# Patient Record
Sex: Female | Born: 1960 | Race: White | Hispanic: No | Smoking: Former smoker
Health system: Southern US, Community
[De-identification: ages and names within clinical notes are randomized; demographics above are authoritative.]

## PROBLEM LIST (undated history)

## (undated) DIAGNOSIS — A630 Anogenital (venereal) warts: Secondary | ICD-10-CM

## (undated) DIAGNOSIS — E7800 Pure hypercholesterolemia, unspecified: Secondary | ICD-10-CM

## (undated) DIAGNOSIS — K219 Gastro-esophageal reflux disease without esophagitis: Secondary | ICD-10-CM

## (undated) DIAGNOSIS — E559 Vitamin D deficiency, unspecified: Secondary | ICD-10-CM

## (undated) HISTORY — DX: Vitamin D deficiency, unspecified: E55.9

## (undated) HISTORY — DX: Gastro-esophageal reflux disease without esophagitis: K21.9

## (undated) HISTORY — DX: Anogenital (venereal) warts: A63.0

## (undated) HISTORY — DX: Pure hypercholesterolemia, unspecified: E78.00

---

## 1996-09-15 HISTORY — PX: BREAST EXCISIONAL BIOPSY: SUR124

## 2005-08-14 ENCOUNTER — Ambulatory Visit: Payer: Self-pay

## 2008-03-07 ENCOUNTER — Ambulatory Visit: Payer: Self-pay

## 2009-05-10 ENCOUNTER — Ambulatory Visit: Payer: Self-pay

## 2010-05-14 ENCOUNTER — Ambulatory Visit: Payer: Self-pay

## 2010-09-15 LAB — HM PAP SMEAR

## 2010-11-25 ENCOUNTER — Ambulatory Visit: Payer: Self-pay | Admitting: Gastroenterology

## 2011-06-18 ENCOUNTER — Ambulatory Visit: Payer: Self-pay | Admitting: Family Medicine

## 2012-10-20 ENCOUNTER — Encounter: Payer: Self-pay | Admitting: Internal Medicine

## 2012-10-21 ENCOUNTER — Encounter: Payer: Self-pay | Admitting: Internal Medicine

## 2012-10-21 ENCOUNTER — Ambulatory Visit (INDEPENDENT_AMBULATORY_CARE_PROVIDER_SITE_OTHER): Payer: 59 | Admitting: Internal Medicine

## 2012-10-21 VITALS — BP 108/80 | HR 72 | Temp 98.8°F | Ht 66.5 in | Wt 163.8 lb

## 2012-10-21 DIAGNOSIS — B001 Herpesviral vesicular dermatitis: Secondary | ICD-10-CM

## 2012-10-21 DIAGNOSIS — L989 Disorder of the skin and subcutaneous tissue, unspecified: Secondary | ICD-10-CM

## 2012-10-21 DIAGNOSIS — B009 Herpesviral infection, unspecified: Secondary | ICD-10-CM

## 2012-10-21 DIAGNOSIS — F17200 Nicotine dependence, unspecified, uncomplicated: Secondary | ICD-10-CM

## 2012-10-21 DIAGNOSIS — R5381 Other malaise: Secondary | ICD-10-CM

## 2012-10-21 DIAGNOSIS — E7800 Pure hypercholesterolemia, unspecified: Secondary | ICD-10-CM

## 2012-10-21 DIAGNOSIS — R5383 Other fatigue: Secondary | ICD-10-CM

## 2012-10-21 DIAGNOSIS — Z139 Encounter for screening, unspecified: Secondary | ICD-10-CM

## 2012-10-21 DIAGNOSIS — Z720 Tobacco use: Secondary | ICD-10-CM

## 2012-10-21 DIAGNOSIS — E559 Vitamin D deficiency, unspecified: Secondary | ICD-10-CM

## 2012-10-21 DIAGNOSIS — K219 Gastro-esophageal reflux disease without esophagitis: Secondary | ICD-10-CM

## 2012-10-21 MED ORDER — PANTOPRAZOLE SODIUM 20 MG PO TBEC: 20.0000 mg | DELAYED_RELEASE_TABLET | Freq: Every day | ORAL | Status: DC

## 2012-10-22 ENCOUNTER — Telehealth: Payer: Self-pay | Admitting: *Deleted

## 2012-10-22 NOTE — Telephone Encounter (Signed)
Pt is coming in for labs on Monday 02.10.2014 what labs and dx would you like? Thank you  

## 2012-10-23 ENCOUNTER — Encounter: Payer: Self-pay | Admitting: Internal Medicine

## 2012-10-23 DIAGNOSIS — Z720 Tobacco use: Secondary | ICD-10-CM | POA: Insufficient documentation

## 2012-10-23 DIAGNOSIS — E559 Vitamin D deficiency, unspecified: Secondary | ICD-10-CM | POA: Insufficient documentation

## 2012-10-23 DIAGNOSIS — E7800 Pure hypercholesterolemia, unspecified: Secondary | ICD-10-CM | POA: Insufficient documentation

## 2012-10-23 DIAGNOSIS — K219 Gastro-esophageal reflux disease without esophagitis: Secondary | ICD-10-CM | POA: Insufficient documentation

## 2012-10-23 DIAGNOSIS — B001 Herpesviral vesicular dermatitis: Secondary | ICD-10-CM | POA: Insufficient documentation

## 2012-10-23 MED ORDER — FAMCICLOVIR 500 MG PO TABS: 500.0000 mg | ORAL_TABLET | Freq: Two times a day (BID) | ORAL | Status: DC | PRN

## 2012-10-23 NOTE — Assessment & Plan Note (Signed)
Takes protonix prn.  Follow.  Avoid foods that aggravate.

## 2012-10-23 NOTE — Assessment & Plan Note (Signed)
Discussed need for smoking cessation.  She is not ready to quit.  Follow.

## 2012-10-23 NOTE — Assessment & Plan Note (Signed)
Check vitamin D level 

## 2012-10-23 NOTE — Assessment & Plan Note (Signed)
Takes famvir intermittently.  Follow.

## 2012-10-23 NOTE — Assessment & Plan Note (Signed)
Low cholesterol diet and exercise.  Check lipid panel.   

## 2012-10-23 NOTE — Progress Notes (Signed)
Subjective:    Patient ID: Peggy Ward, female    DOB: Apr 13, 1961, 52 y.o.   MRN: [id number]  HPI 52 year old female with past history of hypercholesterolemia and GERD who comes in today to follow up on these issues as well as to establish care.  She states she is doing well.  Was previously followed at [facility] by Dr Luella Cook and has seen Dr Burnett Sheng.  Most recently has seen Dr Andrey Spearman.  Last physical 2012.  She has noticed some intermittent heaviness in her legs.  Occurs randomly.  No leg cramping.  Does have issues with spider veins.  She also has some issues with acid reflux.  Takes protonix prn.  May average once every two weeks - taking.  No significant sinus pressure.  She does intermittently notice some left earache.  May be related to weather change.  No change in hearing.  No earache now.  Some issues with fever blisters.  She also reports a persistent left upper arm skin lesion and a nasal lesion.  No nausea or vomiting.     Past Medical History  Diagnosis Date  . Hypercholesterolemia   . GERD (gastroesophageal reflux disease)   . Vitamin D deficiency   . Genital warts    Outpatient Encounter Prescriptions as of 10/21/2012  Medication Sig Dispense Refill  . famciclovir (FAMVIR) 500 MG tablet Take 500 mg by mouth 3 (three) times daily.      . fish oil-omega-3 fatty acids 1000 MG capsule Take 2 g by mouth daily.      . Flaxseed, Linseed, (FLAX SEED OIL) 1000 MG CAPS Take by mouth daily.      . pantoprazole (PROTONIX) 20 MG tablet Take 1 tablet (20 mg total) by mouth daily.  30 tablet  2  . [DISCONTINUED] pantoprazole (PROTONIX) 20 MG tablet Take 20 mg by mouth daily.       No facility-administered encounter medications on file as of 10/21/2012.    Review of Systems Patient denies any headache, lightheadedness or dizziness.  No significant sinus or allergy symptoms.  No earache now.  No chest pain, tightness or palpitations.  No increased shortness of breath, cough or  congestion.  No nausea or vomiting.  Intermittent acid reflux.  No abdominal pain or cramping.  No bowel change, such as diarrhea, constipation, BRBPR or melana.  No urine change.  Some left ankle discomfort at times.  Not a significant issue for her now.        Objective:   Physical Exam Filed Vitals:   10/21/12 1041  BP: 108/80  Pulse: 72  Temp: 98.8 F (63.76 C)   52 year old female in no acute distress.   HEENT:  Nares- clear.  Oropharynx - without lesions.  EAR:  Tympanic membrane without erythema.  NECK:  Supple.  Nontender.  No audible bruit.  HEART:  Appears to be regular. LUNGS:  No crackles or wheezing audible.  Respirations even and unlabored.  RADIAL PULSE:  Equal bilaterally.  ABDOMEN:  Soft, nontender.  Bowel sounds present and normal.  No audible abdominal bruit.   EXTREMITIES:  No increased edema present.  DP pulses palpable and equal bilaterally.   CIRCULATION:  DP pulses palpable and equal bilaterally.  Spider veins present.          Assessment & Plan:  LEG HEAVINESS.  Unclear as to the exact etiology.  Does have some venous insufficiency.  Support hose.  Follow.    EARACHE.  Not an issue  for her now.  No erythema on exam.  Follow.   DERMATOLOGY.  Persistent left upper arm lesion and nasal lesion.  Refer to dermatology for evaluation.    ALCOHOL USE.  Drinks 3-5 glasses of wine per day.  Discussed the need to cut back.  Follow.   FATIGUE.  Check cbc, met c and tsh.   HEALTH MAINTENANCE.  Schedule a physical next visit.  Schedule mammogram.  Obtain outside records.  Colonoscopy 2013 - Dr Bluford Kaufmann.

## 2012-10-24 NOTE — Telephone Encounter (Signed)
Labs are ordered and in system.  Thanks.

## 2012-10-25 ENCOUNTER — Other Ambulatory Visit: Payer: 59

## 2012-12-07 ENCOUNTER — Ambulatory Visit: Payer: Self-pay | Admitting: Internal Medicine

## 2012-12-08 ENCOUNTER — Other Ambulatory Visit (INDEPENDENT_AMBULATORY_CARE_PROVIDER_SITE_OTHER): Payer: 59

## 2012-12-08 DIAGNOSIS — E559 Vitamin D deficiency, unspecified: Secondary | ICD-10-CM

## 2012-12-08 DIAGNOSIS — R5383 Other fatigue: Secondary | ICD-10-CM

## 2012-12-08 DIAGNOSIS — E7800 Pure hypercholesterolemia, unspecified: Secondary | ICD-10-CM

## 2012-12-08 LAB — CBC WITH DIFFERENTIAL/PLATELET
Basophils Relative: 0.7 % (ref 0.0–3.0)
Eosinophils Absolute: 0.1 10*3/uL (ref 0.0–0.7)
Eosinophils Relative: 1.8 % (ref 0.0–5.0)
HCT: 37 % (ref 36.0–46.0)
Hemoglobin: 12.6 g/dL (ref 12.0–15.0)
MCHC: 34.1 g/dL (ref 30.0–36.0)
MCV: 97.1 fl (ref 78.0–100.0)
Monocytes Absolute: 0.4 10*3/uL (ref 0.1–1.0)
Neutro Abs: 3.2 10*3/uL (ref 1.4–7.7)
Neutrophils Relative %: 53.3 % (ref 43.0–77.0)
RBC: 3.81 Mil/uL — ABNORMAL LOW (ref 3.87–5.11)
WBC: 5.9 10*3/uL (ref 4.5–10.5)

## 2012-12-08 LAB — COMPREHENSIVE METABOLIC PANEL
AST: 25 U/L (ref 0–37)
Alkaline Phosphatase: 74 U/L (ref 39–117)
BUN: 12 mg/dL (ref 6–23)
Creatinine, Ser: 0.5 mg/dL (ref 0.4–1.2)
Glucose, Bld: 90 mg/dL (ref 70–99)
Potassium: 3.8 mEq/L (ref 3.5–5.1)
Total Bilirubin: 0.5 mg/dL (ref 0.3–1.2)

## 2012-12-08 LAB — LIPID PANEL
Cholesterol: 184 mg/dL (ref 0–200)
LDL Cholesterol: 101 mg/dL — ABNORMAL HIGH (ref 0–99)
Total CHOL/HDL Ratio: 4
VLDL: 37.4 mg/dL (ref 0.0–40.0)

## 2012-12-09 ENCOUNTER — Telehealth: Payer: Self-pay | Admitting: Internal Medicine

## 2012-12-09 DIAGNOSIS — E559 Vitamin D deficiency, unspecified: Secondary | ICD-10-CM

## 2012-12-09 MED ORDER — ERGOCALCIFEROL 1.25 MG (50000 UT) PO CAPS: 50000.0000 [IU] | ORAL_CAPSULE | ORAL | Status: DC

## 2012-12-09 NOTE — Telephone Encounter (Signed)
Notify pt that her triglycerides are just slightly elevated above goal of 150, but overall look good.  Her cholesterol looks good.  Her other labs are ok except for her vitamin D level is low.  I want her to start a prescription vitamin D supplement (ergocalciferol) that she will take once a week.  I will need to recheck a vitamin D level on her in 7 weeks.  I will send the prescription to her pharmacy.  Please schedule her for a non fasting lab appt in 7 weeks.

## 2012-12-09 NOTE — Telephone Encounter (Signed)
Patient notified and non fasting lab appointment sheduled.

## 2012-12-14 ENCOUNTER — Telehealth: Payer: Self-pay | Admitting: Internal Medicine

## 2012-12-14 NOTE — Telephone Encounter (Signed)
Pt was per scribed Vitamin D and was told to come back in 7 weeks for another recheck but was only per scribed 4 pills. She was wondering if she should come back in a month to have that recheck or if the rx was written wrong.

## 2012-12-14 NOTE — Telephone Encounter (Signed)
Confirm with pt that she is taking the vitamin D once a week.  If so, she is to continue once a week and then she has one refill.

## 2012-12-14 NOTE — Telephone Encounter (Signed)
Pt was notified and advised to contact office before she took her last pill.

## 2012-12-20 ENCOUNTER — Encounter: Payer: Self-pay | Admitting: Internal Medicine

## 2012-12-20 ENCOUNTER — Ambulatory Visit (INDEPENDENT_AMBULATORY_CARE_PROVIDER_SITE_OTHER): Payer: 59 | Admitting: Internal Medicine

## 2012-12-20 VITALS — BP 118/60 | HR 78 | Temp 98.4°F | Resp 18 | Wt 159.5 lb

## 2012-12-20 DIAGNOSIS — Z124 Encounter for screening for malignant neoplasm of cervix: Secondary | ICD-10-CM

## 2012-12-20 DIAGNOSIS — E7800 Pure hypercholesterolemia, unspecified: Secondary | ICD-10-CM

## 2012-12-20 DIAGNOSIS — B001 Herpesviral vesicular dermatitis: Secondary | ICD-10-CM

## 2012-12-20 DIAGNOSIS — F17200 Nicotine dependence, unspecified, uncomplicated: Secondary | ICD-10-CM

## 2012-12-20 DIAGNOSIS — K219 Gastro-esophageal reflux disease without esophagitis: Secondary | ICD-10-CM

## 2012-12-20 DIAGNOSIS — B009 Herpesviral infection, unspecified: Secondary | ICD-10-CM

## 2012-12-20 DIAGNOSIS — Z720 Tobacco use: Secondary | ICD-10-CM

## 2012-12-20 DIAGNOSIS — E559 Vitamin D deficiency, unspecified: Secondary | ICD-10-CM

## 2012-12-21 ENCOUNTER — Other Ambulatory Visit (HOSPITAL_COMMUNITY)
Admission: RE | Admit: 2012-12-21 | Discharge: 2012-12-21 | Disposition: A | Discharge status: Home / Self Care | Payer: 59 | Source: Ambulatory Visit | Attending: Internal Medicine | Admitting: Internal Medicine

## 2012-12-21 DIAGNOSIS — Z1151 Encounter for screening for human papillomavirus (HPV): Secondary | ICD-10-CM | POA: Insufficient documentation

## 2012-12-21 DIAGNOSIS — Z01419 Encounter for gynecological examination (general) (routine) without abnormal findings: Secondary | ICD-10-CM | POA: Insufficient documentation

## 2012-12-23 ENCOUNTER — Encounter: Payer: Self-pay | Admitting: Internal Medicine

## 2012-12-27 ENCOUNTER — Encounter: Payer: Self-pay | Admitting: Internal Medicine

## 2012-12-27 NOTE — Progress Notes (Signed)
  Subjective:    Patient ID: Peggy Ward, female    DOB: 1961-01-11, 52 y.o.   MRN: [id number]  Gynecologic Exam  52 year old female with past history of hypercholesterolemia and GERD who comes in today to follow up on these issues as well as for a complete physical exam.  She states she is doing well.  Denies any cardiac symptoms with increased activity or exertion.  No acid reflux.  No nausea or vomiting.  No bowel change.       Past Medical History  Diagnosis Date  . Hypercholesterolemia   . GERD (gastroesophageal reflux disease)   . Vitamin D deficiency   . Genital warts     Outpatient Encounter Prescriptions as of 12/20/2012  Medication Sig Dispense Refill  . ergocalciferol (VITAMIN D2) 50000 UNITS capsule Take 1 capsule (50,000 Units total) by mouth once a week.  4 capsule  1  . famciclovir (FAMVIR) 500 MG tablet Take 1 tablet (500 mg total) by mouth 2 (two) times daily as needed.  30 tablet  0  . fish oil-omega-3 fatty acids 1000 MG capsule Take 2 g by mouth daily.      . Flaxseed, Linseed, (FLAX SEED OIL) 1000 MG CAPS Take by mouth daily.      . pantoprazole (PROTONIX) 20 MG tablet Take 1 tablet (20 mg total) by mouth daily.  30 tablet  2   No facility-administered encounter medications on file as of 12/20/2012.    Review of Systems Patient denies any headache, lightheadedness or dizziness.  No significant sinus or allergy symptoms.  No chest pain, tightness or palpitations.  No increased shortness of breath, cough or congestion.  No nausea or vomiting.  No acid reflux reported.  On protonix.  No abdominal pain or cramping.  No bowel change, such as diarrhea, constipation, BRBPR or melana.  No urine change.  Discussed support hose last visit for leg aching.          Objective:   Physical Exam  Filed Vitals:   12/20/12 1545  BP: 118/60  Pulse: 78  Temp: 98.4 F (36.9 C)  Resp: 18   Pulse recheck:  36  51 year old female in no acute distress.   HEENT:  Nares- clear.   Oropharynx - without lesions. NECK:  Supple.  Nontender.  No audible bruit.  HEART:  Appears to be regular. LUNGS:  No crackles or wheezing audible.  Respirations even and unlabored.  RADIAL PULSE:  Equal bilaterally.    BREASTS:  No nipple discharge or nipple retraction present.  Could not appreciate any distinct nodules or axillary adenopathy.  ABDOMEN:  Soft, nontender.  Bowel sounds present and normal.  No audible abdominal bruit.  GU:  Normal external genitalia.  Vaginal vault without lesions.  Cervix identified.  Pap performed. Could not appreciate any adnexal masses or tenderness.   RECTAL:  Heme negative.   EXTREMITIES:  No increased edema present.  DP pulses palpable and equal bilaterally.          Assessment & Plan:  LEG HEAVINESS.  Does have some venous insufficiency.  Support hose.  Follow.    ALCOHOL USE.  Drinks 3-5 glasses of wine per day.  Have discussed the need to cut back.  Follow.   HEALTH MAINTENANCE.  Physical today.  Mammogram 12/07/12 - [facility] I.   Colonoscopy 11/25/10 (Dr Bluford Kaufmann) - sigmoid polyp (tubular adenomatous polyp) and diverticulosis.  Recommended repeat colonoscopy 2017.

## 2012-12-27 NOTE — Assessment & Plan Note (Signed)
Vitamin D level just checked - 14.  On ergocalciferol 50,000 units q week now.  Off the otc supolement.  Recheck vitamin D level in a couple of months.

## 2012-12-27 NOTE — Assessment & Plan Note (Signed)
Takes famvir intermittently.  Follow.

## 2012-12-27 NOTE — Assessment & Plan Note (Signed)
Low cholesterol diet and exercise.  Llipid panel just checked 12/08/12 revealed total cholesterol 184, triglycerides 187, HDL 45 and LDL 101.

## 2012-12-27 NOTE — Assessment & Plan Note (Signed)
Have discussed need for smoking cessation.  She is not ready to quit.  Follow.

## 2012-12-27 NOTE — Assessment & Plan Note (Signed)
Takes protonix.  Follow.  Avoid foods that aggravate.

## 2013-01-27 ENCOUNTER — Other Ambulatory Visit (INDEPENDENT_AMBULATORY_CARE_PROVIDER_SITE_OTHER): Payer: 59

## 2013-01-27 DIAGNOSIS — E559 Vitamin D deficiency, unspecified: Secondary | ICD-10-CM

## 2013-01-28 LAB — VITAMIN D 25 HYDROXY (VIT D DEFICIENCY, FRACTURES): Vit D, 25-Hydroxy: 45 ng/mL (ref 30–89)

## 2013-06-20 ENCOUNTER — Ambulatory Visit: Payer: 59 | Admitting: Internal Medicine

## 2013-07-11 ENCOUNTER — Encounter: Payer: Self-pay | Admitting: Internal Medicine

## 2013-07-11 ENCOUNTER — Ambulatory Visit (INDEPENDENT_AMBULATORY_CARE_PROVIDER_SITE_OTHER): Payer: 59 | Admitting: Internal Medicine

## 2013-07-11 ENCOUNTER — Encounter (INDEPENDENT_AMBULATORY_CARE_PROVIDER_SITE_OTHER): Payer: Self-pay

## 2013-07-11 VITALS — BP 120/70 | HR 74 | Temp 97.7°F | Ht 66.5 in | Wt 162.0 lb

## 2013-07-11 DIAGNOSIS — B001 Herpesviral vesicular dermatitis: Secondary | ICD-10-CM

## 2013-07-11 DIAGNOSIS — E559 Vitamin D deficiency, unspecified: Secondary | ICD-10-CM

## 2013-07-11 DIAGNOSIS — B009 Herpesviral infection, unspecified: Secondary | ICD-10-CM

## 2013-07-11 DIAGNOSIS — Z23 Encounter for immunization: Secondary | ICD-10-CM

## 2013-07-11 DIAGNOSIS — K219 Gastro-esophageal reflux disease without esophagitis: Secondary | ICD-10-CM

## 2013-07-11 DIAGNOSIS — F17200 Nicotine dependence, unspecified, uncomplicated: Secondary | ICD-10-CM

## 2013-07-11 DIAGNOSIS — E7800 Pure hypercholesterolemia, unspecified: Secondary | ICD-10-CM

## 2013-07-11 DIAGNOSIS — Z720 Tobacco use: Secondary | ICD-10-CM

## 2013-07-12 ENCOUNTER — Encounter: Payer: Self-pay | Admitting: Internal Medicine

## 2013-07-12 NOTE — Assessment & Plan Note (Signed)
Last vitamin D level wnl.  Off rx vitamin D now.  Taking vitamin D3 1000 q day.  Follow.

## 2013-07-12 NOTE — Assessment & Plan Note (Signed)
Low cholesterol diet and exercise.  Llipid panel last checked 12/08/12 revealed total cholesterol 184, triglycerides 187, HDL 45 and LDL 101.  Will follow.

## 2013-07-12 NOTE — Assessment & Plan Note (Signed)
Discussed need for smoking cessation.  She is not ready to quit.  Has cut down.  Follow.   

## 2013-07-12 NOTE — Assessment & Plan Note (Signed)
Takes famvir intermittently.  Follow.

## 2013-07-12 NOTE — Progress Notes (Signed)
  Subjective:    Patient ID: Peggy Ward, female    DOB: 1961-04-30, 52 y.o.   MRN: [id number]  HPI 52 year old female with past history of hypercholesterolemia and GERD who comes in today for a scheduled follow up.  She states she is doing well.   On protonix prn.  States if she watches what she eats, she does not have a significant issue with acid reflux.   No significant sinus pressure.  Breathing stable.  Eating and drinking well.   No nausea or vomiting.  Bowels stable.  Recent vitamin D level wnl.  Off rx vit D and is now taking vit D3 1000 q day.  States misses some days.      Past Medical History  Diagnosis Date  . Hypercholesterolemia   . GERD (gastroesophageal reflux disease)   . Vitamin D deficiency   . Genital warts     Outpatient Encounter Prescriptions as of 07/11/2013  Medication Sig Dispense Refill  . Cholecalciferol (VITAMIN D3) 1000 UNITS CAPS Take 1 capsule by mouth daily.      . famciclovir (FAMVIR) 500 MG tablet Take 1 tablet (500 mg total) by mouth 2 (two) times daily as needed.  30 tablet  0  . pantoprazole (PROTONIX) 20 MG tablet Take 20 mg by mouth daily as needed.      . [DISCONTINUED] pantoprazole (PROTONIX) 20 MG tablet Take 1 tablet (20 mg total) by mouth daily.  30 tablet  2  . fish oil-omega-3 fatty acids 1000 MG capsule Take 2 g by mouth daily.      . Flaxseed, Linseed, (FLAX SEED OIL) 1000 MG CAPS Take by mouth daily.      . [DISCONTINUED] ergocalciferol (VITAMIN D2) 50000 UNITS capsule Take 1 capsule (50,000 Units total) by mouth once a week.  4 capsule  1   No facility-administered encounter medications on file as of 07/11/2013.    Review of Systems Patient denies any headache, lightheadedness or dizziness.  No significant sinus or allergy symptoms.  No chest pain, tightness or palpitations.  No increased shortness of breath, cough or congestion.  No nausea or vomiting.  Intermittent acid reflux.  States if she watches what she eats - no problems.  No  abdominal pain or cramping.  No bowel change, such as diarrhea, constipation, BRBPR or melana.  No urine change.        Objective:   Physical Exam  Filed Vitals:   07/11/13 0946  BP: 120/70  Pulse: 74  Temp: 97.7 F (3.81 C)   52 year old female in no acute distress.   HEENT:  Nares- clear.  Oropharynx - without lesions.  NECK:  Supple.  Nontender.  No audible bruit.  HEART:  Appears to be regular. LUNGS:  No crackles or wheezing audible.  Respirations even and unlabored.  RADIAL PULSE:  Equal bilaterally.  ABDOMEN:  Soft, nontender.  Bowel sounds present and normal.  No audible abdominal bruit.   EXTREMITIES:  No increased edema present.  DP pulses palpable and equal bilaterally.          Assessment & Plan:  ALCOHOL USE.  Discussed again with her today.  She has cut back on her alcohol intake.  Does not drink daily now.  Follow.     HEALTH MAINTENANCE.  Physical 12/20/12.  Pap negative.   Colonoscopy 11/25/10.  Recommended f/u colonoscopy 2017.  Mammogram 12/07/12 - Birads I.

## 2013-07-12 NOTE — Assessment & Plan Note (Signed)
Takes protonix prn.  Follow.  Avoid foods that aggravate.  Controlled if she watches her diet.

## 2013-12-21 ENCOUNTER — Other Ambulatory Visit: Payer: 59

## 2013-12-23 ENCOUNTER — Encounter: Payer: 59 | Admitting: Internal Medicine

## 2014-02-14 ENCOUNTER — Ambulatory Visit: Payer: Self-pay | Admitting: Internal Medicine

## 2014-02-14 LAB — HM MAMMOGRAPHY: HM Mammogram: NEGATIVE

## 2014-02-15 ENCOUNTER — Encounter: Payer: Self-pay | Admitting: Internal Medicine

## 2014-02-21 ENCOUNTER — Other Ambulatory Visit: Payer: 59

## 2014-02-24 ENCOUNTER — Encounter: Payer: 59 | Admitting: Internal Medicine

## 2014-02-28 ENCOUNTER — Other Ambulatory Visit (INDEPENDENT_AMBULATORY_CARE_PROVIDER_SITE_OTHER): Payer: 59

## 2014-02-28 DIAGNOSIS — E7800 Pure hypercholesterolemia, unspecified: Secondary | ICD-10-CM

## 2014-02-28 DIAGNOSIS — E559 Vitamin D deficiency, unspecified: Secondary | ICD-10-CM

## 2014-02-28 DIAGNOSIS — K219 Gastro-esophageal reflux disease without esophagitis: Secondary | ICD-10-CM

## 2014-02-28 LAB — COMPREHENSIVE METABOLIC PANEL
ALBUMIN: 4 g/dL (ref 3.5–5.2)
ALT: 19 U/L (ref 0–35)
AST: 23 U/L (ref 0–37)
Alkaline Phosphatase: 67 U/L (ref 39–117)
BUN: 12 mg/dL (ref 6–23)
CALCIUM: 9 mg/dL (ref 8.4–10.5)
CHLORIDE: 105 meq/L (ref 96–112)
CO2: 26 meq/L (ref 19–32)
Creatinine, Ser: 0.5 mg/dL (ref 0.4–1.2)
GFR: 127.8 mL/min (ref >60.00)
GLUCOSE: 86 mg/dL (ref 70–99)
POTASSIUM: 4 meq/L (ref 3.5–5.1)
Sodium: 138 mEq/L (ref 135–145)
Total Bilirubin: 0.1 mg/dL — ABNORMAL LOW (ref 0.2–1.2)
Total Protein: 7.1 g/dL (ref 6.0–8.3)

## 2014-02-28 LAB — CBC WITH DIFFERENTIAL/PLATELET
BASOS ABS: 0.1 10*3/uL (ref 0.0–0.1)
BASOS PCT: 0.9 % (ref 0.0–3.0)
EOS ABS: 0.1 10*3/uL (ref 0.0–0.7)
Eosinophils Relative: 2.2 % (ref 0.0–5.0)
HCT: 38.6 % (ref 36.0–46.0)
Hemoglobin: 13.1 g/dL (ref 12.0–15.0)
Lymphocytes Relative: 35.1 % (ref 12.0–46.0)
Lymphs Abs: 2.3 10*3/uL (ref 0.7–4.0)
MCHC: 33.8 g/dL (ref 30.0–36.0)
MCV: 97.6 fl (ref 78.0–100.0)
Monocytes Absolute: 0.5 10*3/uL (ref 0.1–1.0)
Monocytes Relative: 7.6 % (ref 3.0–12.0)
NEUTROS PCT: 54.2 % (ref 43.0–77.0)
Neutro Abs: 3.5 10*3/uL (ref 1.4–7.7)
Platelets: 240 10*3/uL (ref 150.0–400.0)
RBC: 3.95 Mil/uL (ref 3.87–5.11)
RDW: 13 % (ref 11.5–15.5)
WBC: 6.5 10*3/uL (ref 4.0–10.5)

## 2014-02-28 LAB — LIPID PANEL
CHOLESTEROL: 220 mg/dL — AB (ref 0–200)
HDL: 48.2 mg/dL (ref >39.00)
LDL Cholesterol: 113 mg/dL — ABNORMAL HIGH (ref 0–99)
NonHDL: 171.8
TRIGLYCERIDES: 296 mg/dL — AB (ref 0.0–149.0)
Total CHOL/HDL Ratio: 5
VLDL: 59.2 mg/dL — ABNORMAL HIGH (ref 0.0–40.0)

## 2014-02-28 LAB — TSH: TSH: 1.38 u[IU]/mL (ref 0.35–4.50)

## 2014-03-01 LAB — VITAMIN D 25 HYDROXY (VIT D DEFICIENCY, FRACTURES): VIT D 25 HYDROXY: 34 ng/mL (ref 30–89)

## 2014-03-07 ENCOUNTER — Other Ambulatory Visit: Payer: Self-pay | Admitting: Internal Medicine

## 2014-03-07 ENCOUNTER — Encounter: Payer: Self-pay | Admitting: Internal Medicine

## 2014-03-07 ENCOUNTER — Ambulatory Visit (INDEPENDENT_AMBULATORY_CARE_PROVIDER_SITE_OTHER): Payer: 59 | Admitting: Internal Medicine

## 2014-03-07 VITALS — BP 110/80 | HR 70 | Temp 98.3°F | Ht 67.0 in | Wt 163.5 lb

## 2014-03-07 DIAGNOSIS — E7800 Pure hypercholesterolemia, unspecified: Secondary | ICD-10-CM

## 2014-03-07 DIAGNOSIS — E559 Vitamin D deficiency, unspecified: Secondary | ICD-10-CM

## 2014-03-07 DIAGNOSIS — F17200 Nicotine dependence, unspecified, uncomplicated: Secondary | ICD-10-CM

## 2014-03-07 DIAGNOSIS — Z720 Tobacco use: Secondary | ICD-10-CM

## 2014-03-07 DIAGNOSIS — K219 Gastro-esophageal reflux disease without esophagitis: Secondary | ICD-10-CM

## 2014-03-07 NOTE — Progress Notes (Signed)
Pre visit review using our clinic review tool, if applicable. No additional management support is needed unless otherwise documented below in the visit note. 

## 2014-03-12 ENCOUNTER — Encounter: Payer: Self-pay | Admitting: Internal Medicine

## 2014-03-12 NOTE — Assessment & Plan Note (Signed)
Low cholesterol diet and exercise.  Llipid panel just checked and revealed total cholesterol 220, triglycerides 296, HDL 48 and LDL 113.  Add fish oil.  Follow.

## 2014-03-12 NOTE — Progress Notes (Signed)
  Subjective:    Patient ID: Peggy Ward, female    DOB: 08/26/1961, 53 y.o.   MRN: [id number]  HPI 53 year old female with past history of hypercholesterolemia and GERD who comes in today to follow up on these issues as well as for a complete physical exam.   She states she is doing well.  Tries to stay active.  No cardiac symptoms with increased activity or exertion.  No acid reflux reported.  Has protonix.  Bowels stable.  Overall she feels she is doing relatively well.  Does report some bilateral hip discomfort.  Not constant.      Past Medical History  Diagnosis Date  . Hypercholesterolemia   . GERD (gastroesophageal reflux disease)   . Vitamin D deficiency   . Genital warts     Outpatient Encounter Prescriptions as of 03/07/2014  Medication Sig  . Cholecalciferol (VITAMIN D3) 1000 UNITS CAPS Take 1 capsule by mouth daily.  . famciclovir (FAMVIR) 500 MG tablet Take 1 tablet (500 mg total) by mouth 2 (two) times daily as needed.  . fish oil-omega-3 fatty acids 1000 MG capsule Take 2 g by mouth daily.  . Flaxseed, Linseed, (FLAX SEED OIL) 1000 MG CAPS Take by mouth daily.  . pantoprazole (PROTONIX) 20 MG tablet TAKE 1 TABLET (20 MG TOTAL) BY MOUTH DAILY.  . [DISCONTINUED] pantoprazole (PROTONIX) 20 MG tablet Take 20 mg by mouth daily as needed.    Review of Systems Patient denies any headache, lightheadedness or dizziness.  No significant sinus or allergy symptoms.   No chest pain, tightness or palpitations.  No increased shortness of breath, cough or congestion.  No nausea or vomiting.  Reported no increased acid reflux.  No abdominal pain or cramping.  No bowel change, such as diarrhea, constipation, BRBPR or melana.  No urine change.  Recent labs revealed increased triglycerides.        Objective:   Physical Exam  Filed Vitals:   03/07/14 1516  BP: 110/80  Pulse: 70  Temp: 98.3 F (4236.648 C)   53 year old female in no acute distress.   HEENT:  Nares- clear.  Oropharynx  - without lesions. NECK:  Supple.  Nontender.  No audible bruit.  HEART:  Appears to be regular. LUNGS:  No crackles or wheezing audible.  Respirations even and unlabored.  RADIAL PULSE:  Equal bilaterally.    BREASTS:  No nipple discharge or nipple retraction present.  Could not appreciate any distinct nodules or axillary adenopathy.  ABDOMEN:  Soft, nontender.  Bowel sounds present and normal.  No audible abdominal bruit.  GU:  Not performed.     EXTREMITIES:  No increased edema present.  DP pulses palpable and equal bilaterally.           Assessment & Plan:  HEALTH MAINTENANCE.  Physical today.  Mammogram 02/14/14 - Birads I.   Colonoscopy 2013 - Dr Bluford Kaufmannh.  PAP 12/22/12 negative with negative HPV.

## 2014-03-12 NOTE — Assessment & Plan Note (Signed)
Takes protonix.  Follow.  Avoid foods that aggravate.  Controlled if she watches her diet.

## 2014-03-12 NOTE — Assessment & Plan Note (Signed)
Discussed need for smoking cessation.  She is not ready to quit.  Has cut down.  Follow.

## 2014-03-12 NOTE — Assessment & Plan Note (Addendum)
Taking vitamin D3 1000 q day.  Follow.  Vitamin D level just checked and wnl.

## 2014-07-10 ENCOUNTER — Other Ambulatory Visit: Payer: 59

## 2014-07-19 ENCOUNTER — Other Ambulatory Visit: Payer: 59

## 2015-03-12 ENCOUNTER — Encounter: Payer: Self-pay | Admitting: Internal Medicine

## 2015-03-12 ENCOUNTER — Ambulatory Visit (INDEPENDENT_AMBULATORY_CARE_PROVIDER_SITE_OTHER): Payer: Commercial Managed Care - PPO | Admitting: Internal Medicine

## 2015-03-12 VITALS — BP 110/70 | HR 66 | Temp 98.5°F | Ht 66.5 in | Wt 163.5 lb

## 2015-03-12 DIAGNOSIS — Z1239 Encounter for other screening for malignant neoplasm of breast: Secondary | ICD-10-CM

## 2015-03-12 DIAGNOSIS — E7800 Pure hypercholesterolemia, unspecified: Secondary | ICD-10-CM

## 2015-03-12 DIAGNOSIS — K219 Gastro-esophageal reflux disease without esophagitis: Secondary | ICD-10-CM

## 2015-03-12 DIAGNOSIS — Z720 Tobacco use: Secondary | ICD-10-CM | POA: Diagnosis not present

## 2015-03-12 DIAGNOSIS — Z0000 Encounter for general adult medical examination without abnormal findings: Secondary | ICD-10-CM

## 2015-03-12 DIAGNOSIS — E559 Vitamin D deficiency, unspecified: Secondary | ICD-10-CM

## 2015-03-12 NOTE — Progress Notes (Signed)
Pre visit review using our clinic review tool, if applicable. No additional management support is needed unless otherwise documented below in the visit note. 

## 2015-03-12 NOTE — Progress Notes (Signed)
Patient ID: Peggy Ward, female   DOB: [birth year], 54 y.o.   MRN: [id number]   Subjective:    Patient ID: Peggy Ward, female    DOB: 10-30-1960, 54 y.o.   MRN: [id number]  HPI  Patient here to follow up on her current medical issues as well as for a complete physical exam.  Tries to stay active.  Stress is better.  New job.  Enjoys her job.  Tries to stay active.  No cardiac symptoms with increased activity or exertion.  Breathing stable.  Bowels stable.  Still smoking.  Has cut down.  Discussed the need to quit.  Discussed treatment options.     Past Medical History  Diagnosis Date  . Hypercholesterolemia   . GERD (gastroesophageal reflux disease)   . Vitamin D deficiency   . Genital warts     Current Outpatient Prescriptions on File Prior to Visit  Medication Sig Dispense Refill  . famciclovir (FAMVIR) 500 MG tablet Take 1 tablet (500 mg total) by mouth 2 (two) times daily as needed. 30 tablet 0  . pantoprazole (PROTONIX) 20 MG tablet TAKE 1 TABLET (20 MG TOTAL) BY MOUTH DAILY. (Patient taking differently: TAKE 1 TABLET (20 MG TOTAL) BY MOUTH PRN) 30 tablet 2  . Cholecalciferol (VITAMIN D3) 1000 UNITS CAPS Take 1 capsule by mouth daily.    . fish oil-omega-3 fatty acids 1000 MG capsule Take 2 g by mouth daily.    . Flaxseed, Linseed, (FLAX SEED OIL) 1000 MG CAPS Take by mouth daily.     No current facility-administered medications on file prior to visit.    Review of Systems  Constitutional: Negative for appetite change and unexpected weight change.  HENT: Negative for congestion and sinus pressure.   Eyes: Negative for pain and visual disturbance.  Respiratory: Negative for cough, chest tightness and shortness of breath.   Cardiovascular: Negative for chest pain, palpitations and leg swelling.  Gastrointestinal: Negative for nausea, vomiting, abdominal pain and diarrhea.  Genitourinary: Negative for dysuria and difficulty urinating.  Musculoskeletal: Negative for back  pain and joint swelling.  Skin: Negative for color change and rash.  Neurological: Negative for dizziness, light-headedness and headaches.  Hematological: Negative for adenopathy. Does not bruise/bleed easily.  Psychiatric/Behavioral: Negative for dysphoric mood and agitation.       Objective:    Physical Exam  Constitutional: She is oriented to person, place, and time. She appears well-developed and well-nourished.  HENT:  Nose: Nose normal.  Mouth/Throat: Oropharynx is clear and moist.  Eyes: Right eye exhibits no discharge. Left eye exhibits no discharge. No scleral icterus.  Neck: Neck supple. No thyromegaly present.  Cardiovascular: Normal rate and regular rhythm.   Pulmonary/Chest: Breath sounds normal. No accessory muscle usage. No tachypnea. No respiratory distress. She has no decreased breath sounds. She has no wheezes. She has no rhonchi. Right breast exhibits no inverted nipple, no mass, no nipple discharge and no tenderness (no axillary adenopathy). Left breast exhibits no inverted nipple, no mass, no nipple discharge and no tenderness (no axilarry adenopathy).  Abdominal: Soft. Bowel sounds are normal. There is no tenderness.  Musculoskeletal: She exhibits no edema or tenderness.  Lymphadenopathy:    She has no cervical adenopathy.  Neurological: She is alert and oriented to person, place, and time.  Skin: Skin is warm. No rash noted.  Psychiatric: She has a normal mood and affect. Her behavior is normal.    BP 110/70 mmHg  Pulse 66  Temp(Src) 98.5 F (36.9  C) (Oral)  Ht 5' 6.5" (1.689 m)  Wt 163 lb 8 oz (74.163 kg)  BMI 26.00 kg/m2  SpO2 99% Wt Readings from Last 3 Encounters:  03/12/15 163 lb 8 oz (74.163 kg)  03/07/14 163 lb 8 oz (74.163 kg)  07/11/13 162 lb (73.483 kg)     Lab Results  Component Value Date   WBC 6.5 02/28/2014   HGB 13.1 02/28/2014   HCT 38.6 02/28/2014   PLT 240.0 02/28/2014   GLUCOSE 86 02/28/2014   CHOL 220* 02/28/2014   TRIG  296.0* 02/28/2014   HDL 48.20 02/28/2014   LDLCALC 113* 02/28/2014   ALT 19 02/28/2014   AST 23 02/28/2014   NA 138 02/28/2014   K 4.0 02/28/2014   CL 105 02/28/2014   CREATININE 0.5 02/28/2014   BUN 12 02/28/2014   CO2 26 02/28/2014   TSH 1.38 02/28/2014       Assessment & Plan:   Problem List Items Addressed This Visit    GERD (gastroesophageal reflux disease)    On protonix.  No problems reported.        Relevant Orders   CBC with Differential/Platelet   TSH   Health care maintenance    Mammogram 02/14/14 - Birads I.  Schedule mammogram.  PAP 12/22/12 - negative with negative HPV.  Colonoscopy 11/25/10.  Follow up colonoscopy in five years.        Hypercholesterolemia    Low cholesterol diet and exercise.  Follow lipid panel and liver function tests.        Relevant Orders   Comprehensive metabolic panel   Lipid panel   Tobacco abuse    Discussed the need to quit smoking.  She has cut down.  Discussed treatment options.  She is not ready to quit.  Follow.       Relevant Orders   Vit D  25 hydroxy (rtn osteoporosis monitoring)   Vitamin D deficiency    Check vitamin D level.         Other Visit Diagnoses    Breast cancer screening    -  Primary    Relevant Orders    MM DIGITAL SCREENING BILATERAL        Dale , MD

## 2015-03-14 ENCOUNTER — Encounter: Payer: Self-pay | Admitting: Internal Medicine

## 2015-03-14 DIAGNOSIS — Z0000 Encounter for general adult medical examination without abnormal findings: Secondary | ICD-10-CM | POA: Insufficient documentation

## 2015-03-14 NOTE — Assessment & Plan Note (Signed)
Check vitamin D level 

## 2015-03-14 NOTE — Assessment & Plan Note (Signed)
Mammogram 02/14/14 - Birads I.  Schedule mammogram.  PAP 12/22/12 - negative with negative HPV.  Colonoscopy 11/25/10.  Follow up colonoscopy in five years.

## 2015-03-14 NOTE — Assessment & Plan Note (Signed)
Low cholesterol diet and exercise.  Follow lipid panel and liver function tests.  

## 2015-03-14 NOTE — Assessment & Plan Note (Signed)
Discussed the need to quit smoking.  She has cut down.  Discussed treatment options.  She is not ready to quit.  Follow.

## 2015-03-14 NOTE — Assessment & Plan Note (Signed)
On protonix.  No problems reported.  

## 2015-03-15 ENCOUNTER — Ambulatory Visit
Admission: RE | Admit: 2015-03-15 | Discharge: 2015-03-15 | Disposition: A | Discharge status: Home / Self Care | Payer: Commercial Managed Care - PPO | Source: Ambulatory Visit | Attending: Internal Medicine | Admitting: Internal Medicine

## 2015-03-15 DIAGNOSIS — Z1239 Encounter for other screening for malignant neoplasm of breast: Secondary | ICD-10-CM

## 2015-03-26 ENCOUNTER — Other Ambulatory Visit: Payer: Commercial Managed Care - PPO

## 2015-03-28 ENCOUNTER — Ambulatory Visit
Admission: RE | Admit: 2015-03-28 | Discharge: 2015-03-28 | Disposition: A | Discharge status: Home / Self Care | Payer: Commercial Managed Care - PPO | Source: Ambulatory Visit | Attending: Internal Medicine | Admitting: Internal Medicine

## 2015-03-28 DIAGNOSIS — Z1231 Encounter for screening mammogram for malignant neoplasm of breast: Secondary | ICD-10-CM | POA: Diagnosis present

## 2015-04-02 ENCOUNTER — Other Ambulatory Visit (INDEPENDENT_AMBULATORY_CARE_PROVIDER_SITE_OTHER): Payer: Commercial Managed Care - PPO

## 2015-04-02 DIAGNOSIS — K219 Gastro-esophageal reflux disease without esophagitis: Secondary | ICD-10-CM | POA: Diagnosis not present

## 2015-04-02 DIAGNOSIS — Z720 Tobacco use: Secondary | ICD-10-CM | POA: Diagnosis not present

## 2015-04-02 DIAGNOSIS — E7800 Pure hypercholesterolemia, unspecified: Secondary | ICD-10-CM

## 2015-04-02 LAB — VITAMIN D 25 HYDROXY (VIT D DEFICIENCY, FRACTURES): VITD: 17.95 ng/mL — AB (ref 30.00–100.00)

## 2015-04-02 LAB — CBC WITH DIFFERENTIAL/PLATELET
Basophils Absolute: 0 10*3/uL (ref 0.0–0.1)
Basophils Relative: 0.5 % (ref 0.0–3.0)
EOS PCT: 2.4 % (ref 0.0–5.0)
Eosinophils Absolute: 0.2 10*3/uL (ref 0.0–0.7)
HCT: 39.8 % (ref 36.0–46.0)
HEMOGLOBIN: 13.4 g/dL (ref 12.0–15.0)
Lymphocytes Relative: 36.3 % (ref 12.0–46.0)
Lymphs Abs: 2.5 10*3/uL (ref 0.7–4.0)
MCHC: 33.7 g/dL (ref 30.0–36.0)
MCV: 100 fl (ref 78.0–100.0)
MONOS PCT: 6.6 % (ref 3.0–12.0)
Monocytes Absolute: 0.4 10*3/uL (ref 0.1–1.0)
NEUTROS PCT: 54.2 % (ref 43.0–77.0)
Neutro Abs: 3.7 10*3/uL (ref 1.4–7.7)
PLATELETS: 233 10*3/uL (ref 150.0–400.0)
RBC: 3.98 Mil/uL (ref 3.87–5.11)
RDW: 13.6 % (ref 11.5–15.5)
WBC: 6.8 10*3/uL (ref 4.0–10.5)

## 2015-04-02 LAB — COMPREHENSIVE METABOLIC PANEL
ALK PHOS: 66 U/L (ref 39–117)
ALT: 19 U/L (ref 0–35)
AST: 24 U/L (ref 0–37)
Albumin: 4.2 g/dL (ref 3.5–5.2)
BUN: 15 mg/dL (ref 6–23)
CO2: 26 mEq/L (ref 19–32)
Calcium: 9.1 mg/dL (ref 8.4–10.5)
Chloride: 105 mEq/L (ref 96–112)
Creatinine, Ser: 0.54 mg/dL (ref 0.40–1.20)
GFR: 124.56 mL/min (ref >60.00)
GLUCOSE: 87 mg/dL (ref 70–99)
Potassium: 4 mEq/L (ref 3.5–5.1)
SODIUM: 140 meq/L (ref 135–145)
TOTAL PROTEIN: 7.3 g/dL (ref 6.0–8.3)
Total Bilirubin: 0.2 mg/dL (ref 0.2–1.2)

## 2015-04-02 LAB — LIPID PANEL
CHOLESTEROL: 232 mg/dL — AB (ref 0–200)
HDL: 63.1 mg/dL (ref >39.00)
NonHDL: 168.9
Total CHOL/HDL Ratio: 4
Triglycerides: 209 mg/dL — ABNORMAL HIGH (ref 0.0–149.0)
VLDL: 41.8 mg/dL — ABNORMAL HIGH (ref 0.0–40.0)

## 2015-04-02 LAB — TSH: TSH: 1.44 u[IU]/mL (ref 0.35–4.50)

## 2015-04-02 LAB — LDL CHOLESTEROL, DIRECT: LDL DIRECT: 150 mg/dL

## 2015-04-03 ENCOUNTER — Other Ambulatory Visit: Payer: Self-pay | Admitting: Internal Medicine

## 2015-04-03 MED ORDER — VITAMIN D (ERGOCALCIFEROL) 1.25 MG (50000 UNIT) PO CAPS: 50000.0000 [IU] | ORAL_CAPSULE | ORAL | Status: DC

## 2015-04-03 NOTE — Progress Notes (Signed)
rx sent in for ergocalciferol 50000 q week x 8 weeks.

## 2015-09-16 ENCOUNTER — Emergency Department: Payer: Commercial Managed Care - PPO

## 2015-09-16 ENCOUNTER — Encounter: Payer: Self-pay | Admitting: *Deleted

## 2015-09-16 ENCOUNTER — Emergency Department
Admission: EM | Admit: 2015-09-16 | Discharge: 2015-09-16 | Disposition: A | Discharge status: Home / Self Care | Payer: Commercial Managed Care - PPO | Attending: Emergency Medicine | Admitting: Emergency Medicine

## 2015-09-16 DIAGNOSIS — Y9389 Activity, other specified: Secondary | ICD-10-CM | POA: Diagnosis not present

## 2015-09-16 DIAGNOSIS — S99911A Unspecified injury of right ankle, initial encounter: Secondary | ICD-10-CM | POA: Diagnosis present

## 2015-09-16 DIAGNOSIS — F17210 Nicotine dependence, cigarettes, uncomplicated: Secondary | ICD-10-CM | POA: Diagnosis not present

## 2015-09-16 DIAGNOSIS — Y92009 Unspecified place in unspecified non-institutional (private) residence as the place of occurrence of the external cause: Secondary | ICD-10-CM | POA: Diagnosis not present

## 2015-09-16 DIAGNOSIS — E785 Hyperlipidemia, unspecified: Secondary | ICD-10-CM | POA: Diagnosis not present

## 2015-09-16 DIAGNOSIS — Y998 Other external cause status: Secondary | ICD-10-CM | POA: Diagnosis not present

## 2015-09-16 DIAGNOSIS — S82891A Other fracture of right lower leg, initial encounter for closed fracture: Secondary | ICD-10-CM

## 2015-09-16 DIAGNOSIS — Z79899 Other long term (current) drug therapy: Secondary | ICD-10-CM | POA: Diagnosis not present

## 2015-09-16 DIAGNOSIS — S82831A Other fracture of upper and lower end of right fibula, initial encounter for closed fracture: Secondary | ICD-10-CM | POA: Insufficient documentation

## 2015-09-16 DIAGNOSIS — W500XXA Accidental hit or strike by another person, initial encounter: Secondary | ICD-10-CM | POA: Insufficient documentation

## 2015-09-16 MED ORDER — NAPROXEN 500 MG PO TABS: 500.0000 mg | ORAL_TABLET | Freq: Two times a day (BID) | ORAL | Status: DC

## 2015-09-16 MED ORDER — NAPROXEN 500 MG PO TABS
500.0000 mg | ORAL_TABLET | Freq: Once | ORAL | Status: AC
  Administered 2015-09-16: 500 mg via ORAL
  Filled 2015-09-16: qty 1

## 2015-09-16 NOTE — Discharge Instructions (Signed)
°Cast or Splint Care  ° ° °Casts and splints support injured limbs and keep bones from moving while they heal. It is important to care for your cast or splint at home.  °HOME CARE INSTRUCTIONS  °Keep the cast or splint uncovered during the drying period. It can take 24 to 48 hours to dry if it is made of plaster. A fiberglass cast will dry in less than 1 hour.  °Do not rest the cast on anything harder than a pillow for the first 24 hours.  °Do not put weight on your injured limb or apply pressure to the cast until your health care provider gives you permission.  °Keep the cast or splint dry. Wet casts or splints can lose their shape and may not support the limb as well. A wet cast that has lost its shape can also create harmful pressure on your skin when it dries. Also, wet skin can become infected.  °Cover the cast or splint with a plastic bag when bathing or when out in the rain or snow. If the cast is on the trunk of the body, take sponge baths until the cast is removed.  °If your cast does become wet, dry it with a towel or a blow dryer on the cool setting only. °Keep your cast or splint clean. Soiled casts may be wiped with a moistened cloth.  °Do not place any hard or soft foreign objects under your cast or splint, such as cotton, toilet paper, lotion, or powder.  °Do not try to scratch the skin under the cast with any object. The object could get stuck inside the cast. Also, scratching could lead to an infection. If itching is a problem, use a blow dryer on a cool setting to relieve discomfort.  °Do not trim or cut your cast or remove padding from inside of it.  °Exercise all joints next to the injury that are not immobilized by the cast or splint. For example, if you have a long leg cast, exercise the hip joint and toes. If you have an arm cast or splint, exercise the shoulder, elbow, thumb, and fingers.  °Elevate your injured arm or leg on 1 or 2 pillows for the first 1 to 3 days to decrease swelling and  pain. It is best if you can comfortably elevate your cast so it is higher than your heart. °SEEK MEDICAL CARE IF:  °Your cast or splint cracks.  °Your cast or splint is too tight or too loose.  °You have unbearable itching inside the cast.  °Your cast becomes wet or develops a soft spot or area.  °You have a bad smell coming from inside your cast.  °You get an object stuck under your cast.  °Your skin around the cast becomes red or raw.  °You have new pain or worsening pain after the cast has been applied. °SEEK IMMEDIATE MEDICAL CARE IF:  °You have fluid leaking through the cast.  °You are unable to move your fingers or toes.  °You have discolored (blue or white), cool, painful, or very swollen fingers or toes beyond the cast.  °You have tingling or numbness around the injured area.  °You have severe pain or pressure under the cast.  °You have any difficulty with your breathing or have shortness of breath.  °You have chest pain. °This information is not intended to replace advice given to you by your health care provider. Make sure you discuss any questions you have with your health care provider.  °  Document Released: 08/29/2000 Document Revised: 06/22/2013 Document Reviewed: 03/10/2013  °Elsevier Interactive Patient Education ©2016 [employer]  ° °

## 2015-09-16 NOTE — ED Provider Notes (Signed)
[facility] Emergency Department Provider Note  ____________________________________________  Time seen: Approximately 6:37 PM  I have reviewed the triage vital signs and the nursing notes.   HISTORY  Chief Complaint Ankle Pain    HPI Peggy Ward is a 55 y.o. female patient complaining of right ankle pain secondary to a fall. She was days of her husband yesterday and fell and in house and fell across patient's ankle. Patient notes increased pain and swelling this morning. No palliative measures taken until she had ice pack in the triage area. She rating the pain as a 7/10 describe the pain as sharp. Patient refused pain medication.   Past Medical History  Diagnosis Date  . Hypercholesterolemia   . GERD (gastroesophageal reflux disease)   . Vitamin D deficiency   . Genital warts     Patient Active Problem List   Diagnosis Date Noted  . Health care maintenance 03/14/2015  . Tobacco abuse 10/23/2012  . Fever blister 10/23/2012  . GERD (gastroesophageal reflux disease) 10/23/2012  . Vitamin D deficiency 10/23/2012  . Hypercholesterolemia 10/23/2012    Past Surgical History  Procedure Laterality Date  . Breast biopsy Right 1998    neg    Current Outpatient Rx  Name  Route  Sig  Dispense  Refill  . Cholecalciferol (VITAMIN D3) 1000 UNITS CAPS   Oral   Take 1 capsule by mouth daily.         . famciclovir (FAMVIR) 500 MG tablet   Oral   Take 1 tablet (500 mg total) by mouth 2 (two) times daily as needed.   30 tablet   0   . fish oil-omega-3 fatty acids 1000 MG capsule   Oral   Take 2 g by mouth daily.         . Flaxseed, Linseed, (FLAX SEED OIL) 1000 MG CAPS   Oral   Take by mouth daily.         . naproxen (NAPROSYN) 500 MG tablet   Oral   Take 1 tablet (500 mg total) by mouth 2 (two) times daily with a meal.   20 tablet   0   . pantoprazole (PROTONIX) 20 MG tablet      TAKE 1 TABLET (20 MG TOTAL) BY MOUTH  DAILY. Patient taking differently: TAKE 1 TABLET (20 MG TOTAL) BY MOUTH PRN   30 tablet   2   . Vitamin D, Ergocalciferol, (DRISDOL) 50000 UNITS CAPS capsule   Oral   Take 1 capsule (50,000 Units total) by mouth every 7 (seven) days.   8 capsule   0     Allergies Review of patient's allergies indicates no known allergies.  Family History  Problem Relation Age of Onset  . Cancer Father     prostate  . Depression Father   . Heart disease Father     MI, stent  . Mental retardation Father   . Hyperlipidemia Mother   . Cancer Paternal Grandfather     prostate    Social History Social History  Substance Use Topics  . Smoking status: Current Every Day Smoker    Types: Cigarettes  . Smokeless tobacco: Never Used  . Alcohol Use: 0.0 oz/week    0 Standard drinks or equivalent per week     Comment: 3-5 glasses of wine per day    Review of Systems Constitutional: No fever/chills Eyes: No visual changes. ENT: No sore throat. Cardiovascular: Denies chest pain. Respiratory: Denies shortness of breath. Gastrointestinal: No  abdominal pain.  No nausea, no vomiting.  No diarrhea.  No constipation. Genitourinary: Negative for dysuria. Musculoskeletal: Right lateral ankle pain Skin: Negative for rash. Neurological: Negative for headaches, focal weakness or numbness. Endocrine:Hyperlipidemia   ____________________________________________   PHYSICAL EXAM:  VITAL SIGNS: ED Triage Vitals  Enc Vitals Group     BP --      Pulse Rate 09/16/15 1807 69     Resp 09/16/15 1807 20     Temp 09/16/15 1807 98.6 F (37 C)     Temp Source 09/16/15 1807 Oral     SpO2 09/16/15 1807 97 %     Weight 09/16/15 1807 150 lb (68.04 kg)     Height 09/16/15 1807 5\' 7"  (1.702 m)     Head Cir --      Peak Flow --      Pain Score 09/16/15 1809 7     Pain Loc --      Pain Edu? --      Excl. in GC? --     Constitutional: Alert and oriented. Well appearing and in no acute distress. Eyes:  Conjunctivae are normal. PERRL. EOMI. Head: Atraumatic. Nose: No congestion/rhinnorhea. Mouth/Throat: Mucous membranes are moist.  Oropharynx non-erythematous. Neck: No stridor.  No cervical spine tenderness to palpation. Hematological/Lymphatic/Immunilogical: No cervical lymphadenopathy. Cardiovascular: Normal rate, regular rhythm. Grossly normal heart sounds.  Good peripheral circulation. Respiratory: Normal respiratory effort.  No retractions. Lungs CTAB. Gastrointestinal: Soft and nontender. No distention. No abdominal bruits. No CVA tenderness. Musculoskeletal: No deformity but obvious edema lateral malleolus. Neurologic:  Normal speech and language. No gross focal neurologic deficits are appreciated. No gait instability. Skin:  Skin is warm, dry and intact. No rash noted. Psychiatric: Mood and affect are normal. Speech and behavior are normal.  ____________________________________________   LABS (all labs ordered are listed, but only abnormal results are displayed)  Labs Reviewed - No data to display ____________________________________________  EKG   ____________________________________________  RADIOLOGY  X-rays reveal a linear limits see overlay in the distal fibula suggestive a non-displace distal fibular fracture.  I, Joni Reining, personally viewed and evaluated these images (plain radiographs) as part of my medical decision making, as well as reviewing the written report by the radiologist. ____________________________________________   PROCEDURES  Procedure(s) performed: None  Critical Care performed: No  ____________________________________________   INITIAL IMPRESSION / ASSESSMENT AND PLAN / ED COURSE  Pertinent labs & imaging results that were available during my care of the patient were reviewed by me and considered in my medical decision making (see chart for details).  Distal fibular fracture. As x-ray findings with patient. He will place an OCL  ankle splint and given crutches pending further evaluation by orthopedics. She given a prescription for naproxen and tramadol. Patient advised to call orthopedic clinic in the morning for follow-up appointment. ____________________________________________   FINAL CLINICAL IMPRESSION(S) / ED DIAGNOSES  Final diagnoses:  Closed right ankle fracture, initial encounter      Joni Reining, PA-C 09/16/15 1848  Phineas Semen, MD 09/16/15 [phone]

## 2015-09-16 NOTE — ED Notes (Signed)
Splint and crutch teaching done by ED tech.
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
## 2015-10-08 ENCOUNTER — Ambulatory Visit (INDEPENDENT_AMBULATORY_CARE_PROVIDER_SITE_OTHER): Payer: Commercial Managed Care - PPO

## 2015-10-08 ENCOUNTER — Other Ambulatory Visit: Payer: Commercial Managed Care - PPO

## 2015-10-08 ENCOUNTER — Encounter: Payer: Self-pay | Admitting: Podiatry

## 2015-10-08 ENCOUNTER — Ambulatory Visit (INDEPENDENT_AMBULATORY_CARE_PROVIDER_SITE_OTHER): Payer: Commercial Managed Care - PPO | Admitting: Podiatry

## 2015-10-08 VITALS — BP 120/85 | HR 79 | Resp 16

## 2015-10-08 DIAGNOSIS — S82401A Unspecified fracture of shaft of right fibula, initial encounter for closed fracture: Secondary | ICD-10-CM

## 2015-10-08 DIAGNOSIS — S93401A Sprain of unspecified ligament of right ankle, initial encounter: Secondary | ICD-10-CM | POA: Diagnosis not present
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
## 2015-10-10 ENCOUNTER — Telehealth: Payer: Self-pay | Admitting: *Deleted

## 2015-10-10 DIAGNOSIS — S82401P Unspecified fracture of shaft of right fibula, subsequent encounter for closed fracture with malunion: Secondary | ICD-10-CM

## 2015-10-10 DIAGNOSIS — S93401D Sprain of unspecified ligament of right ankle, subsequent encounter: Secondary | ICD-10-CM

## 2015-10-10 NOTE — Telephone Encounter (Addendum)
Pt states she called [facility] for MRI appt and the orders were not there.  Orders faxed to [facility].  Left message [phone] for pt informing orders sent to [facility].  10/12/2015 - [facility] OLIVER A. [id number] STATES NO PRIOR AUTHORIZATION IS NEEDED, REFERENCE #OLIVER A 10/12/2015.  FAXED TO [facility].  10/15/2015 - PT STATES [facility] has earliest appt 10/31/2015, are there other options.  I spoke with Cher at [facility] and they are scheduling as early as 700am on 10/17/2015.  I informed pt she could schedule with them, once I reordered to their facility.  Pt agree and I gave appt line.  Faxed orders to [facility].  10/22/2015-LEFT MESSAGE informing pt Dr. Al Corpus would like to schedule her to discuss her MRI results.

## 2015-10-10 NOTE — Telephone Encounter (Signed)
Yes, please. Thank you.

## 2015-10-22 ENCOUNTER — Ambulatory Visit
Admission: RE | Admit: 2015-10-22 | Discharge: 2015-10-22 | Disposition: A | Discharge status: Home / Self Care | Payer: Commercial Managed Care - PPO | Source: Ambulatory Visit | Attending: Podiatry | Admitting: Podiatry

## 2015-10-22 DIAGNOSIS — S82401P Unspecified fracture of shaft of right fibula, subsequent encounter for closed fracture with malunion: Secondary | ICD-10-CM

## 2015-10-22 DIAGNOSIS — S93401D Sprain of unspecified ligament of right ankle, subsequent encounter: Secondary | ICD-10-CM

## 2015-10-22 NOTE — Telephone Encounter (Signed)
-----   Message from Elinor Parkinson, [place] sent at 10/22/2015  2:03 PM EST ----- May have her in once I get back from my eye surgery.

## 2015-10-31 ENCOUNTER — Ambulatory Visit (INDEPENDENT_AMBULATORY_CARE_PROVIDER_SITE_OTHER): Payer: Commercial Managed Care - PPO | Admitting: Podiatry

## 2015-10-31 ENCOUNTER — Ambulatory Visit: Payer: Commercial Managed Care - PPO

## 2015-10-31 ENCOUNTER — Encounter: Payer: Self-pay | Admitting: Podiatry

## 2015-10-31 VITALS — BP 109/69 | HR 80 | Resp 18

## 2015-10-31 DIAGNOSIS — S93401A Sprain of unspecified ligament of right ankle, initial encounter: Secondary | ICD-10-CM

## 2015-10-31 NOTE — Patient Instructions (Signed)
Pre-Operative Instructions  Congratulations, you have decided to take an important step to improving your quality of life.  You can be assured that the doctors of [facility] will be with you every step of the way.  1. Plan to be at the surgery center/hospital at least 1 (one) hour prior to your scheduled time unless otherwise directed by the surgical center/hospital staff.  You must have a responsible adult accompany you, remain during the surgery and drive you home.  Make sure you have directions to the surgical center/hospital and know how to get there on time. 2. For hospital based surgery you will need to obtain a history and physical form from your family physician within 1 month prior to the date of surgery- we will give you a form for you primary physician.  3. We make every effort to accommodate the date you request for surgery.  There are however, times where surgery dates or times have to be moved.  We will contact you as soon as possible if a change in schedule is required.   4. No Aspirin/Ibuprofen for one week before surgery.  If you are on aspirin, any non-steroidal anti-inflammatory medications (Mobic, Aleve, Ibuprofen) you should stop taking it 7 days prior to your surgery.  You make take Tylenol  For pain prior to surgery.  5. Medications- If you are taking daily heart and blood pressure medications, seizure, reflux, allergy, asthma, anxiety, pain or diabetes medications, make sure the surgery center/hospital is aware before the day of surgery so they may notify you which medications to take or avoid the day of surgery. 6. No food or drink after midnight the night before surgery unless directed otherwise by surgical center/hospital staff. 7. No alcoholic beverages 24 hours prior to surgery.  No smoking 24 hours prior to or 24 hours after surgery. 8. Wear loose pants or shorts- loose enough to fit over bandages, boots, and casts. 9. No slip on shoes, sneakers are best. 10. Bring  your boot with you to the surgery center/hospital.  Also bring crutches or a walker if your physician has prescribed it for you.  If you do not have this equipment, it will be provided for you after surgery. 11. If you have not been contracted by the surgery center/hospital by the day before your surgery, call to confirm the date and time of your surgery. 12. Leave-time from work may vary depending on the type of surgery you have.  Appropriate arrangements should be made prior to surgery with your employer. 13. Prescriptions will be provided immediately following surgery by your doctor.  Have these filled as soon as possible after surgery and take the medication as directed. 14. Remove nail polish on the operative foot. 15. Wash the night before surgery.  The night before surgery wash the foot and leg well with the antibacterial soap provided and water paying special attention to beneath the toenails and in between the toes.  Rinse thoroughly with water and dry well with a towel.  Perform this wash unless told not to do so by your physician.  Enclosed: 1 Ice pack (please put in freezer the night before surgery)   1 Hibiclens skin cleaner   Pre-op Instructions  If you have any questions regarding the instructions, do not hesitate to call our office.  [place]: [address] [place], [place] [address] [phone]  [place]: [address] [place], [place] [address] [phone]  [place]: [address]  [place], [place] [address] [phone]  Dr. Richard   Tuchman DPM, Dr. Norman Regal DPM Dr. Richard Sikora DPM, Dr. M. Todd Sashia Campas DPM, Dr. Kathryn Egerton DPM 

## 2015-10-31 NOTE — Progress Notes (Signed)
She presents today for her MRI results. She presents with her cam walker intact and states the ankle really has not got any better. She states that some of the pain has moved from in front of the ankle to beneath the ankle.  Objective: Vital signs are stable alert and oriented 3. MRI states complete rupture of the anterior talofibular ligament of the right ankle. Pulses are palpable to the right lower extremity she has some mild tenderness on palpation of the anterior talofibular ligament area with some tenderness on palpation of the fibula at its inferior most aspect.  Assessment: Anterior talofibular ligament tear right ankle.  Plan: We discussed surgical intervention today consisting of a lateral ankle stabilization and repair of the anterior talofibular ligament. I answered all the questions regarding this procedure to the best of my ability in layman's terms. She understood this was amenable to and signed all 3 pages of the consent form. We did discuss the possible postop complications which may include but are not limited to postoperative pain bleeding swelling infection recurrence need for further surgery over correction under correction development of a DVT or pulmonary embolism which could be life-threatening.she saw another patient consent form also understands that she will be placed in a cast and that she will be nonweightbearing using either a knee scooter or crutches.

## 2015-11-02 ENCOUNTER — Telehealth: Payer: Self-pay | Admitting: *Deleted

## 2015-11-02 NOTE — Telephone Encounter (Signed)
"  Dr. Al Corpus told me to call you if I hadn't heard from you in a few days.  I need to schedule surgery."  Dr. Geryl Rankins next available date is 11/23/2015.  "That's the soonest he has?  I'm ready to get out of this boot that I've been wearing for about 6 weeks.  He had mentioned that sometime he does them on a Thursday."  He only does that around peak time in December.  "I guess put me there.  Does he have many cancellations, can you put me on a waiting list?"  He doesn't have many cancellations but I will put you on the waiting list.  You will need to register with the surgical center.  Instructions are in the brochure from the surgical center in blue bag that was given to you.  "Okay, I'll go ahead and do that."

## 2015-11-05 ENCOUNTER — Ambulatory Visit: Payer: Commercial Managed Care - PPO | Admitting: Podiatry

## 2015-11-06 ENCOUNTER — Telehealth: Payer: Self-pay | Admitting: *Deleted

## 2015-11-06 NOTE — Telephone Encounter (Signed)
"  I'm scheduled for surgery on 11/23/2015.  Is that the earliest?  Are there any other options or other patient cancellations?"    I'm returning your call.  You left a message with the nurse about surgery date.  "Yes, I wanted to know if I could do it sooner.  I've been dealing with this since the first of January.  I'm ready to get this over with."  Dr. Al Corpus doesn't have anything available until March 10th.  There hasn't been any cancellations.  I spoke to Dr. Al Corpus regarding this and he is aware that there isn't anything available any time sooner.  "Okay, thank you."

## 2015-11-09 ENCOUNTER — Telehealth: Payer: Self-pay | Admitting: Podiatry

## 2015-11-09 ENCOUNTER — Telehealth: Payer: Self-pay | Admitting: *Deleted

## 2015-11-09 DIAGNOSIS — M79673 Pain in unspecified foot: Secondary | ICD-10-CM

## 2015-11-09 NOTE — Telephone Encounter (Signed)
PT IS REQUESTING MRI RESULTS NOT SURE IF I CAN GIVE THEM TO HER. AND IF I CAN NOT SURE WHERE TO FIND THEM

## 2015-11-09 NOTE — Telephone Encounter (Signed)
Pt states would like copy of MRI, she is going to another doctor on 11/12/2015.  Faxed copy of MRI results to Tammy at the [facility] office with note instructing her to give to pt, that will be there before 115pm.

## 2015-11-16 ENCOUNTER — Telehealth: Payer: Self-pay | Admitting: *Deleted

## 2015-11-16 NOTE — Telephone Encounter (Signed)
"  Patient called and canceled her surgery with Dr. Al CorpusHyatt on 11/23/2015."    I left Aram BeechamCynthia a message to call me back.  I want to know if patient left a reason for the cancellation.  I called and left patient messages to give me a call back.  I want to know if she wants to reschedule.  "Patient said she went to get a second opinion and was told surgery wasn't needed."

## 2015-11-16 NOTE — Telephone Encounter (Signed)
"  I'm returning your call."  I received a call from the surgical center and they said you wanted to cancel your surgery.  "I do."  Is there a particular reason for the cancellation?  "Yes, I went and saw an Orthopedist and he said it wasn't necessary."  Okay, I'll let Dr. Al CorpusHyatt know.  Take Care.  "Thank you."

## 2015-11-26 ENCOUNTER — Telehealth: Payer: Self-pay

## 2015-11-28 ENCOUNTER — Encounter: Payer: Self-pay | Admitting: Podiatry

## 2016-01-09 ENCOUNTER — Encounter: Payer: Self-pay | Admitting: Family Medicine

## 2016-01-09 ENCOUNTER — Ambulatory Visit (INDEPENDENT_AMBULATORY_CARE_PROVIDER_SITE_OTHER): Payer: Commercial Managed Care - PPO | Admitting: Family Medicine

## 2016-01-09 VITALS — BP 122/84 | HR 66 | Temp 97.9°F | Ht 66.5 in | Wt 164.0 lb

## 2016-01-09 DIAGNOSIS — M79671 Pain in right foot: Secondary | ICD-10-CM

## 2016-01-09 DIAGNOSIS — H00016 Hordeolum externum left eye, unspecified eyelid: Secondary | ICD-10-CM | POA: Diagnosis not present

## 2016-01-09 DIAGNOSIS — H00019 Hordeolum externum unspecified eye, unspecified eyelid: Secondary | ICD-10-CM | POA: Insufficient documentation

## 2016-01-09 NOTE — Assessment & Plan Note (Signed)
Lesion consistent with stye. Given duration advised patient needs evaluation by ophthalmology. She will call to set up an appointment.

## 2016-01-09 NOTE — Progress Notes (Signed)
Pre visit review using our clinic review tool, if applicable. No additional management support is needed unless otherwise documented below in the visit note. 

## 2016-01-09 NOTE — Progress Notes (Signed)
Patient ID: Peggy Ward, female   DOB: 07/20/1961, 55 y.o.   MRN: [id number]  Marikay AlarEric Adean Milosevic, MD Phone: [phone]  Peggy DoppDebra K Ward is a 55 y.o. female who presents today for same-day visit.  Patient has been dealing with a sprained ankle in her right leg for a number of months. She's been evaluated by orthopedic surgery for this. She's been in a brace. She notes several weeks ago her distal foot began to swell and hurt. She notes it hurts over the dorsal aspect of her third metatarsal. There is some swelling. No appreciable erythema. No injury noted to the forefoot. Has been icing it with minimal benefit. No anti-inflammatories taken.  She also notes a stye on her left lower eyelid that has been there for 3-4 weeks. She contacted her ophthalmologist's office initially and they told her to use warm compresses. It has not improved. Some days it is smaller and other days it is larger. No eye symptoms with this. No vision changes.   ROS see history of present illness  Objective  Physical Exam Filed Vitals:   01/09/16 1121  BP: 122/84  Pulse: 66  Temp: 97.9 F (36.6 C)   Physical Exam  Constitutional: She is well-developed, well-nourished, and in no distress.  Eyes: Conjunctivae are normal. Pupils are equal, round, and reactive to light.  Left lower eyelid middle of the eye with stye that is nontender, there are no erythematous skin changes around the eye  Cardiovascular: Normal rate, regular rhythm and normal heart sounds.   Pulmonary/Chest: Effort normal and breath sounds normal.  Musculoskeletal:  Right dorsal forefoot with mild swelling and mild tenderness over the distal third metatarsal, there are no overlying skin changes, there is no induration, no other tenderness in her foot, no other swelling in her foot, no malleoli tenderness on the right side, no ankle swelling, 2+ DP pulse, foot is warm and well-perfused Left foot and ankle with note tenderness, swelling, or skin  changes, 2+ DP pulse, foot is warm and well-perfused  Neurological: She is alert.  5 out of 5 strength bilateral plantar flexion and dorsiflexion, sensation to light touch intact bilaterally lower extremities  Skin: Skin is warm and dry. She is not diaphoretic.     Assessment/Plan: Please see individual problem list.  Stye Lesion consistent with stye. Given duration advised patient needs evaluation by ophthalmology. She will call to set up an appointment.  Right foot pain Patient with pain in her right foot. Suspect soft tissue injury. Less likely stress fracture. She has been in a brace for sprained ankle and reports alteration in gait with this. We will obtain x-ray imaging of her right foot to evaluate for fracture. If positive for fracture would refer back to orthopedic surgery. If negative for fracture we'll place her back in the boot for 2 weeks to rest the ankle and foot. Ibuprofen or Naprosyn for discomfort. Continue to ice. Given return precautions.    Orders Placed This Encounter  Procedures  . DG Foot Complete Right    Standing Status: Future     Number of Occurrences:      Standing Expiration Date: 03/10/2017    Order Specific Question:  Reason for Exam (SYMPTOM  OR DIAGNOSIS REQUIRED)    Answer:  right fore foot pain and swelling    Order Specific Question:  Is the patient pregnant?    Answer:  No    Order Specific Question:  Preferred imaging location?    Answer:  [facility]  Tommi Rumps, MD [place]

## 2016-01-09 NOTE — Assessment & Plan Note (Signed)
Patient with pain in her right foot. Suspect soft tissue injury. Less likely stress fracture. She has been in a brace for sprained ankle and reports alteration in gait with this. We will obtain x-ray imaging of her right foot to evaluate for fracture. If positive for fracture would refer back to orthopedic surgery. If negative for fracture we'll place her back in the boot for 2 weeks to rest the ankle and foot. Ibuprofen or Naprosyn for discomfort. Continue to ice. Given return precautions.

## 2016-01-09 NOTE — Patient Instructions (Signed)
Nice to meet you. Discomfort is likely related to soft tissue injury, though given your change in walking with your sprained ankle could be related to a stress fracture. We'll obtain an x-ray of your foot to evaluate this further. You can use Naprosyn you have at home 500 mg twice daily or you can take ibuprofen 800 mg every 8 hours as needed for pain. Do not take both of these. You should continue to ice it and increase icing to 3-4 times a day. If you develop numbness, weakness, worsening swelling, worsening pain, or any new or changing symptoms please seek medical attention.

## 2016-01-10 ENCOUNTER — Other Ambulatory Visit: Payer: Commercial Managed Care - PPO

## 2016-01-11 ENCOUNTER — Ambulatory Visit (INDEPENDENT_AMBULATORY_CARE_PROVIDER_SITE_OTHER)
Admission: RE | Admit: 2016-01-11 | Discharge: 2016-01-11 | Disposition: A | Discharge status: Home / Self Care | Payer: Commercial Managed Care - PPO | Source: Ambulatory Visit | Attending: Family Medicine | Admitting: Family Medicine

## 2016-01-11 DIAGNOSIS — M79671 Pain in right foot: Secondary | ICD-10-CM | POA: Diagnosis not present

## 2016-03-12 ENCOUNTER — Encounter: Payer: Self-pay | Admitting: Internal Medicine

## 2016-03-12 ENCOUNTER — Ambulatory Visit (INDEPENDENT_AMBULATORY_CARE_PROVIDER_SITE_OTHER): Payer: Commercial Managed Care - PPO | Admitting: Internal Medicine

## 2016-03-12 ENCOUNTER — Other Ambulatory Visit (HOSPITAL_COMMUNITY)
Admission: RE | Admit: 2016-03-12 | Discharge: 2016-03-12 | Disposition: A | Discharge status: Home / Self Care | Payer: Commercial Managed Care - PPO | Source: Ambulatory Visit | Attending: Internal Medicine | Admitting: Internal Medicine

## 2016-03-12 VITALS — BP 122/100 | HR 75 | Temp 97.7°F | Resp 18 | Ht 67.0 in | Wt 163.5 lb

## 2016-03-12 DIAGNOSIS — Z1211 Encounter for screening for malignant neoplasm of colon: Secondary | ICD-10-CM

## 2016-03-12 DIAGNOSIS — Z1151 Encounter for screening for human papillomavirus (HPV): Secondary | ICD-10-CM | POA: Diagnosis present

## 2016-03-12 DIAGNOSIS — Z1239 Encounter for other screening for malignant neoplasm of breast: Secondary | ICD-10-CM | POA: Diagnosis not present

## 2016-03-12 DIAGNOSIS — K219 Gastro-esophageal reflux disease without esophagitis: Secondary | ICD-10-CM

## 2016-03-12 DIAGNOSIS — E7800 Pure hypercholesterolemia, unspecified: Secondary | ICD-10-CM

## 2016-03-12 DIAGNOSIS — Z0000 Encounter for general adult medical examination without abnormal findings: Secondary | ICD-10-CM

## 2016-03-12 DIAGNOSIS — Z01419 Encounter for gynecological examination (general) (routine) without abnormal findings: Secondary | ICD-10-CM | POA: Insufficient documentation

## 2016-03-12 DIAGNOSIS — Z720 Tobacco use: Secondary | ICD-10-CM

## 2016-03-12 DIAGNOSIS — M79671 Pain in right foot: Secondary | ICD-10-CM | POA: Diagnosis not present

## 2016-03-12 DIAGNOSIS — E559 Vitamin D deficiency, unspecified: Secondary | ICD-10-CM

## 2016-03-12 DIAGNOSIS — Z124 Encounter for screening for malignant neoplasm of cervix: Secondary | ICD-10-CM | POA: Diagnosis not present

## 2016-03-12 NOTE — Progress Notes (Signed)
Patient ID: Peggy Ward, female   DOB: 12/23/1960, 55 y.o.   MRN: [id number]   Subjective:    Patient ID: Peggy Doppebra K Bollman, female    DOB: 09/17/1960, 55 y.o.   MRN: [id number]  HPI  Patient here for a physical exam.  Had right ankle sprain.  Has seen podiatry and orthopedics.  Recommended orthotics.  Had more pain in her foot.  Xray with question of stress fracture.  Still with pain in the ball of her foot.  Limits her activity.  Request referral back to ortho for persistent pain.  She continues to smoke.  Discussed the need to quit smoking.  She desires not to quit.  No chest pain.  Breathing stable.  No acid reflux.  No abdominal pain or cramping.  Bowel stable.     Past Medical History  Diagnosis Date  . Hypercholesterolemia   . GERD (gastroesophageal reflux disease)   . Vitamin D deficiency   . Genital warts    Past Surgical History  Procedure Laterality Date  . Breast biopsy Right 1998    neg   Family History  Problem Relation Age of Onset  . Cancer Father     prostate  . Depression Father   . Heart disease Father     MI, stent  . Mental retardation Father   . Hyperlipidemia Mother   . Cancer Paternal Grandfather     prostate   Social History   Social History  . Marital Status: Married    Spouse Name: N/A  . Number of Children: 2  . Years of Education: N/A   Occupational History  .     Social History Main Topics  . Smoking status: Current Every Day Smoker    Types: Cigarettes  . Smokeless tobacco: Never Used  . Alcohol Use: 0.0 oz/week    0 Standard drinks or equivalent per week     Comment: 3-5 glasses of wine per day  . Drug Use: No  . Sexual Activity: Not Asked   Other Topics Concern  . None   Social History Narrative    Outpatient Encounter Prescriptions as of 03/12/2016  Medication Sig  . Cholecalciferol (VITAMIN D3) 1000 UNITS CAPS Take 1 capsule by mouth daily.  . famciclovir (FAMVIR) 500 MG tablet Take 1 tablet (500 mg total) by mouth 2  (two) times daily as needed.  . fish oil-omega-3 fatty acids 1000 MG capsule Take 2 g by mouth daily.  . Flaxseed, Linseed, (FLAX SEED OIL) 1000 MG CAPS Take by mouth daily.  . naproxen (NAPROSYN) 500 MG tablet Take 1 tablet (500 mg total) by mouth 2 (two) times daily with a meal.  . pantoprazole (PROTONIX) 20 MG tablet TAKE 1 TABLET (20 MG TOTAL) BY MOUTH DAILY. (Patient taking differently: TAKE 1 TABLET (20 MG TOTAL) BY MOUTH PRN)  . Vitamin D, Ergocalciferol, (DRISDOL) 50000 UNITS CAPS capsule Take 1 capsule (50,000 Units total) by mouth every 7 (seven) days.   No facility-administered encounter medications on file as of 03/12/2016.    Review of Systems  Constitutional: Negative for appetite change and unexpected weight change.  HENT: Negative for congestion and sinus pressure.   Respiratory: Negative for cough, chest tightness and shortness of breath.   Cardiovascular: Negative for chest pain, palpitations and leg swelling.  Gastrointestinal: Negative for nausea, vomiting, abdominal pain and diarrhea.  Genitourinary: Negative for dysuria and difficulty urinating.  Musculoskeletal: Negative for back pain and joint swelling.  Skin: Negative for color change and  rash.  Neurological: Negative for dizziness, light-headedness and headaches.  Psychiatric/Behavioral: Negative for dysphoric mood and agitation.       Objective:     Blood pressure rechecked by me:  122/78-80.  Physical Exam  Constitutional: She is oriented to person, place, and time. She appears well-developed and well-nourished. No distress.  HENT:  Nose: Nose normal.  Mouth/Throat: Oropharynx is clear and moist.  Eyes: Right eye exhibits no discharge. Left eye exhibits no discharge. No scleral icterus.  Neck: Neck supple. No thyromegaly present.  Cardiovascular: Normal rate and regular rhythm.   Pulmonary/Chest: Breath sounds normal. No accessory muscle usage. No tachypnea. No respiratory distress. She has no decreased  breath sounds. She has no wheezes. She has no rhonchi. Right breast exhibits no inverted nipple, no mass, no nipple discharge and no tenderness (no axillary adenopathy). Left breast exhibits no inverted nipple, no mass, no nipple discharge and no tenderness (no axilarry adenopathy).  Abdominal: Soft. Bowel sounds are normal. There is no tenderness.  Genitourinary:  Normal external genitalia.  Vaginal vault without lesions.  Cervix identified.  Pap smear performed.  Could not appreciate any adnexal masses or tenderness.    Musculoskeletal: She exhibits no edema or tenderness.  Lymphadenopathy:    She has no cervical adenopathy.  Neurological: She is alert and oriented to person, place, and time.  Skin: Skin is warm. No rash noted. No erythema.  Psychiatric: She has a normal mood and affect. Her behavior is normal.    BP 122/100 mmHg  Pulse 75  Temp(Src) 97.7 F (36.5 C) (Oral)  Resp 18  Ht 5\' 7"  (1.702 m)  Wt 163 lb 8 oz (74.163 kg)  BMI 25.60 kg/m2  SpO2 97% Wt Readings from Last 3 Encounters:  03/12/16 163 lb 8 oz (74.163 kg)  01/09/16 164 lb (74.39 kg)  09/16/15 150 lb (68.04 kg)     Lab Results  Component Value Date   WBC 6.8 04/02/2015   HGB 13.4 04/02/2015   HCT 39.8 04/02/2015   PLT 233.0 04/02/2015   GLUCOSE 87 04/02/2015   CHOL 232* 04/02/2015   TRIG 209.0* 04/02/2015   HDL 63.10 04/02/2015   LDLDIRECT 150.0 04/02/2015   LDLCALC 113* 02/28/2014   ALT 19 04/02/2015   AST 24 04/02/2015   NA 140 04/02/2015   K 4.0 04/02/2015   CL 105 04/02/2015   CREATININE 0.54 04/02/2015   BUN 15 04/02/2015   CO2 26 04/02/2015   TSH 1.44 04/02/2015    Dg Foot Complete Right  01/11/2016  CLINICAL DATA:  Right forefoot pain and swelling for 2 months EXAM: RIGHT FOOT COMPLETE - 3+ VIEW COMPARISON:  None. FINDINGS: Three views of the right foot submitted. No displaced fracture or subluxation. There is subtle cortical irregularity distal shaft of second metatarsal. Subtle healing  stress fracture cannot be excluded. Clinical correlation is necessary. Further correlation with MRI could be performed as clinically warranted. Small plantar spur of calcaneus. IMPRESSION: No displaced fracture or subluxation. There is subtle cortical irregularity distal shaft of second metatarsal. Subtle healing stress fracture cannot be excluded. Clinical correlation is necessary. Further correlation with MRI could be performed as clinically warranted. Small plantar spur of calcaneus. Electronically Signed   By: Natasha MeadLiviu  Pop M.D.   On: 01/11/2016 14:07       Assessment & Plan:   Problem List Items Addressed This Visit    GERD (gastroesophageal reflux disease)    On protonix.  No upper symptoms reported.  Health care maintenance    Physical today 03/12/16.  Mammogram 03/29/15 - Birads I.  Schedule f/u mammogram.  PAP 03/12/16.  Colonoscopy 11/25/10.  Recommended f/u colonoscopy in five years.   Refer to GI.        Hypercholesterolemia    Low cholesterol diet and exercise.  Follow lipid panel.       Relevant Orders   CBC with Differential/Platelet   Comprehensive metabolic panel   TSH   Lipid panel   Right foot pain    Persistent pain as outlined.  Recent xray with question of stress fracture.  Refer back to ortho.        Relevant Orders   Ambulatory referral to Orthopedic Surgery   Tobacco abuse    Discussed the need to quit smoking.  Discussed screening CT.  Will see if qualifies.        Vitamin D deficiency    Check vitamin D level.        Relevant Orders   VITAMIN D 25 Hydroxy (Vit-D Deficiency, Fractures)    Other Visit Diagnoses    Routine general medical examination at a health care facility    -  Primary    Breast cancer screening        Relevant Orders    MM DIGITAL SCREENING BILATERAL    Colon cancer screening        Relevant Orders    Ambulatory referral to Gastroenterology    Pap smear for cervical cancer screening        Relevant Orders    Cytology - PAP  (Completed)        Dale [place], MD

## 2016-03-12 NOTE — Progress Notes (Signed)
Pre-visit discussion using our clinic review tool. No additional management support is needed unless otherwise documented below in the visit note.  

## 2016-03-12 NOTE — Assessment & Plan Note (Addendum)
Physical today 03/12/16.  Mammogram 03/29/15 - Birads I.  Schedule f/u mammogram.  PAP 03/12/16.  Colonoscopy 11/25/10.  Recommended f/u colonoscopy in five years.   Refer to GI.

## 2016-03-13 LAB — CYTOLOGY - PAP

## 2016-03-14 ENCOUNTER — Encounter: Payer: Self-pay | Admitting: *Deleted

## 2016-03-16 ENCOUNTER — Encounter: Payer: Self-pay | Admitting: Internal Medicine

## 2016-03-16 NOTE — Assessment & Plan Note (Signed)
Low cholesterol diet and exercise.  Follow lipid panel.   

## 2016-03-16 NOTE — Assessment & Plan Note (Signed)
On protonix.  No upper symptoms reported.   

## 2016-03-16 NOTE — Assessment & Plan Note (Signed)
Check vitamin D level 

## 2016-03-16 NOTE — Assessment & Plan Note (Signed)
Discussed the need to quit smoking.  Discussed screening CT.  Will see if qualifies.

## 2016-03-16 NOTE — Assessment & Plan Note (Signed)
Persistent pain as outlined.  Recent xray with question of stress fracture.  Refer back to ortho.

## 2016-03-19 ENCOUNTER — Telehealth: Payer: Self-pay | Admitting: *Deleted

## 2016-03-19 NOTE — Telephone Encounter (Signed)
Received referral for low dose lung cancer screening CT scan. Voicemail left at phone number listed in EMR for patient to call me back to facilitate scheduling scan.  

## 2016-03-27 ENCOUNTER — Other Ambulatory Visit (INDEPENDENT_AMBULATORY_CARE_PROVIDER_SITE_OTHER): Payer: Commercial Managed Care - PPO

## 2016-03-27 DIAGNOSIS — E559 Vitamin D deficiency, unspecified: Secondary | ICD-10-CM | POA: Diagnosis not present

## 2016-03-27 DIAGNOSIS — E7800 Pure hypercholesterolemia, unspecified: Secondary | ICD-10-CM

## 2016-03-27 LAB — COMPREHENSIVE METABOLIC PANEL
ALK PHOS: 71 U/L (ref 39–117)
ALT: 12 U/L (ref 0–35)
AST: 16 U/L (ref 0–37)
Albumin: 4.4 g/dL (ref 3.5–5.2)
BUN: 13 mg/dL (ref 6–23)
CHLORIDE: 108 meq/L (ref 96–112)
CO2: 29 meq/L (ref 19–32)
Calcium: 9.4 mg/dL (ref 8.4–10.5)
Creatinine, Ser: 0.57 mg/dL (ref 0.40–1.20)
GFR: 116.61 mL/min (ref >60.00)
GLUCOSE: 101 mg/dL — AB (ref 70–99)
POTASSIUM: 4.1 meq/L (ref 3.5–5.1)
Sodium: 139 mEq/L (ref 135–145)
TOTAL PROTEIN: 7.4 g/dL (ref 6.0–8.3)
Total Bilirubin: 0.3 mg/dL (ref 0.2–1.2)

## 2016-03-27 LAB — CBC WITH DIFFERENTIAL/PLATELET
BASOS PCT: 0.5 % (ref 0.0–3.0)
Basophils Absolute: 0 10*3/uL (ref 0.0–0.1)
EOS PCT: 2.3 % (ref 0.0–5.0)
Eosinophils Absolute: 0.2 10*3/uL (ref 0.0–0.7)
HCT: 39.9 % (ref 36.0–46.0)
Hemoglobin: 13.3 g/dL (ref 12.0–15.0)
LYMPHS ABS: 2.2 10*3/uL (ref 0.7–4.0)
Lymphocytes Relative: 29.7 % (ref 12.0–46.0)
MCHC: 33.4 g/dL (ref 30.0–36.0)
MCV: 97.1 fl (ref 78.0–100.0)
MONO ABS: 0.5 10*3/uL (ref 0.1–1.0)
MONOS PCT: 7.2 % (ref 3.0–12.0)
NEUTROS ABS: 4.5 10*3/uL (ref 1.4–7.7)
NEUTROS PCT: 60.3 % (ref 43.0–77.0)
PLATELETS: 266 10*3/uL (ref 150.0–400.0)
RBC: 4.11 Mil/uL (ref 3.87–5.11)
RDW: 13.4 % (ref 11.5–15.5)
WBC: 7.5 10*3/uL (ref 4.0–10.5)

## 2016-03-27 LAB — LIPID PANEL
CHOL/HDL RATIO: 4
Cholesterol: 229 mg/dL — ABNORMAL HIGH (ref 0–200)
HDL: 60.8 mg/dL (ref >39.00)
LDL Cholesterol: 140 mg/dL — ABNORMAL HIGH (ref 0–99)
NonHDL: 168
TRIGLYCERIDES: 139 mg/dL (ref 0.0–149.0)
VLDL: 27.8 mg/dL (ref 0.0–40.0)

## 2016-03-27 LAB — TSH: TSH: 1.37 u[IU]/mL (ref 0.35–4.50)

## 2016-03-27 LAB — VITAMIN D 25 HYDROXY (VIT D DEFICIENCY, FRACTURES): VITD: 26.2 ng/mL — ABNORMAL LOW (ref 30.00–100.00)

## 2016-03-28 ENCOUNTER — Encounter: Payer: Self-pay | Admitting: *Deleted

## 2016-04-02 ENCOUNTER — Ambulatory Visit
Admission: RE | Admit: 2016-04-02 | Discharge: 2016-04-02 | Disposition: A | Discharge status: Home / Self Care | Payer: Commercial Managed Care - PPO | Source: Ambulatory Visit | Attending: Internal Medicine | Admitting: Internal Medicine

## 2016-04-02 DIAGNOSIS — Z1239 Encounter for other screening for malignant neoplasm of breast: Secondary | ICD-10-CM

## 2016-04-11 ENCOUNTER — Other Ambulatory Visit: Payer: Self-pay | Admitting: Internal Medicine

## 2016-04-11 ENCOUNTER — Ambulatory Visit
Admission: RE | Admit: 2016-04-11 | Discharge: 2016-04-11 | Disposition: A | Discharge status: Home / Self Care | Payer: Commercial Managed Care - PPO | Source: Ambulatory Visit | Attending: Internal Medicine | Admitting: Internal Medicine

## 2016-04-11 DIAGNOSIS — Z1239 Encounter for other screening for malignant neoplasm of breast: Secondary | ICD-10-CM

## 2016-04-11 DIAGNOSIS — Z1231 Encounter for screening mammogram for malignant neoplasm of breast: Secondary | ICD-10-CM | POA: Diagnosis not present

## 2016-05-09 ENCOUNTER — Telehealth: Payer: Self-pay | Admitting: *Deleted

## 2016-05-09 NOTE — Telephone Encounter (Signed)
Received referral for low dose lung cancer screening CT scan. Attempted to schedule scan for patient but she is unable to talk while at work. She is to call me back to facilitate scheduling scan.

## 2016-06-12 ENCOUNTER — Telehealth: Payer: Self-pay | Admitting: *Deleted

## 2016-06-12 NOTE — Telephone Encounter (Signed)
Received referral for low dose CT lung cancer screening. Despite multiple attempts at all contact numbers available, have not been able to arrange for shared decision making visit and CT scan. Letter mailed to patient in final attempt to contact patient. I will be happy to assist in the future if patient so desires. Will forward to referring provider.  

## 2016-09-11 ENCOUNTER — Ambulatory Visit: Payer: Commercial Managed Care - PPO | Admitting: Internal Medicine

## 2016-10-10 ENCOUNTER — Telehealth: Payer: Self-pay | Admitting: Internal Medicine

## 2016-10-10 NOTE — Telephone Encounter (Signed)
Pt called disputing a no show fee from 09/11/16. Pt stated that she did not make this appointment and did not agree to it. Please advise, thank you!  Call pt @ [phone]

## 2016-10-16 NOTE — Telephone Encounter (Signed)
This has been completed.

## 2016-12-11 ENCOUNTER — Telehealth: Payer: Self-pay | Admitting: *Deleted

## 2016-12-11 NOTE — Telephone Encounter (Signed)
Patient has been experiencing random dizziness and heaviness in both legs.  *Pt was transferred to to [facility]  Pt contact [phone]

## 2016-12-11 NOTE — Telephone Encounter (Signed)
FYI this patient is on your schedule for Monday! thanks

## 2016-12-11 NOTE — Telephone Encounter (Signed)
Patient Name: Peggy SinDEBRA Haas DOB: 08/17/1961 Initial Comment Caller states she's having dizziness, heaviness in her legs. Nurse Assessment Nurse: Loletta Specterorbin, RN, Misty StanleyLisa Date/Time Lamount Cohen(Eastern Time): 12/11/2016 10:15:01 AM Confirm and document reason for call. If symptomatic, describe symptoms. ---Caller states she has had heaviness in her legs unchanged from last office visit. She has increased dizziness in the last 6 months. Does the patient have any new or worsening symptoms? ---Yes Will a triage be completed? ---Yes Related visit to physician within the last 2 weeks? ---No Does the PT have any chronic conditions? (i.e. diabetes, asthma, etc.) ---No Is this a behavioral health or substance abuse call? ---No Guidelines Guideline Title Affirmed Question Affirmed Notes Dizziness - Lightheadedness [1] MILD dizziness (e.g., walking normally) AND [2] has NOT been evaluated by physician for this (Exception: dizziness caused by heat exposure, sudden standing, or poor fluid intake) Final Disposition User See PCP When Office is Open (within 3 days) Loletta Specterorbin, RN, Misty StanleyLisa Comments no appt available with PCP. No appt available this week. Appt scheduled for Monday, April 2nd at 4pm with Dr. Adriana Simasook Disagree/Comply: Comply

## 2016-12-15 ENCOUNTER — Encounter: Payer: Self-pay | Admitting: Family Medicine

## 2016-12-15 ENCOUNTER — Ambulatory Visit (INDEPENDENT_AMBULATORY_CARE_PROVIDER_SITE_OTHER): Payer: Commercial Managed Care - PPO | Admitting: Family Medicine

## 2016-12-15 DIAGNOSIS — R42 Dizziness and giddiness: Secondary | ICD-10-CM

## 2016-12-15 NOTE — Patient Instructions (Signed)
Let me know if you change your mind.  Follow up with Dr. Lorin Picket.  Take care  Dr. Adriana Simas

## 2016-12-15 NOTE — Assessment & Plan Note (Signed)
New problem. Uncertain etiology/prognosis at this time. Discussed watchful waiting vs referral to neuro. Patient elected to wait on referral.

## 2016-12-15 NOTE — Progress Notes (Signed)
   Subjective:  Patient ID: Peggy Ward, female    DOB: [birth year]  Age: 56 y.o. MRN: [id number]  CC: "Dizziness"  HPI:  56 year old female presents with the above complaint.  Dizziness  X 6 months.  Worsening as of late (becoming more frequent).  Described as feeling off balance.  Occurs predominantly with walking.  Lasts for seconds and then resolves.  No reports of vertigo.  No presyncope.  No associated headache, vision changes, chest pain, nausea, vomiting.  No other associated symptoms.  No other complaints at this time.   Social Hx   Social History   Social History  . Marital status: Married    Spouse name: N/A  . Number of children: 2  . Years of education: N/A   Occupational History  .  [employer]   Social History Main Topics  . Smoking status: Current Every Day Smoker    Types: Cigarettes  . Smokeless tobacco: Never Used  . Alcohol use 0.0 oz/week     Comment: 3-5 glasses of wine per day  . Drug use: No  . Sexual activity: Not Asked   Other Topics Concern  . None   Social History Narrative  . None    Review of Systems  Constitutional: Negative.   Neurological: Positive for dizziness.     Objective:  BP 124/70   Pulse (!) 108   Temp 98.7 F (37.1 C) (Oral)   Wt 168 lb (76.2 kg)   SpO2 96%   BMI 26.31 kg/m   BP/Weight 12/15/2016 03/12/2016 01/09/2016  Systolic BP 124 122 122  Diastolic BP 70 100 84  Wt. (Lbs) 168 163.5 164  BMI 26.31 25.6 26.08    Physical Exam  Constitutional: She is oriented to person, place, and time. She appears well-developed. No distress.  Cardiovascular: Normal rate and regular rhythm.   Pulmonary/Chest: Effort normal and breath sounds normal.  Neurological: She is alert and oriented to person, place, and time. No cranial nerve deficit.  Vitals reviewed.   Lab Results  Component Value Date   WBC 7.5 03/27/2016   HGB 13.3 03/27/2016   HCT 39.9 03/27/2016   PLT 266.0 03/27/2016   GLUCOSE 101 (H)  03/27/2016   CHOL 229 (H) 03/27/2016   TRIG 139.0 03/27/2016   HDL 60.80 03/27/2016   LDLDIRECT 150.0 04/02/2015   LDLCALC 140 (H) 03/27/2016   ALT 12 03/27/2016   AST 16 03/27/2016   NA 139 03/27/2016   K 4.1 03/27/2016   CL 108 03/27/2016   CREATININE 0.57 03/27/2016   BUN 13 03/27/2016   CO2 29 03/27/2016   TSH 1.37 03/27/2016    Assessment & Plan:   Problem List Items Addressed This Visit    Equilibrium disorder    New problem. Uncertain etiology/prognosis at this time. Discussed watchful waiting vs referral to neuro. Patient elected to wait on referral.          Follow-up: PRN  Everlene Other DO [facility]

## 2017-06-05 NOTE — Telephone Encounter (Signed)
error 

## 2017-06-09 ENCOUNTER — Encounter: Payer: Self-pay | Admitting: Internal Medicine

## 2017-06-09 ENCOUNTER — Ambulatory Visit (INDEPENDENT_AMBULATORY_CARE_PROVIDER_SITE_OTHER): Payer: Commercial Managed Care - PPO | Admitting: Internal Medicine

## 2017-06-09 VITALS — BP 118/76 | HR 72 | Temp 98.5°F | Resp 14 | Ht 67.0 in | Wt 170.2 lb

## 2017-06-09 DIAGNOSIS — Z720 Tobacco use: Secondary | ICD-10-CM

## 2017-06-09 DIAGNOSIS — Z1211 Encounter for screening for malignant neoplasm of colon: Secondary | ICD-10-CM

## 2017-06-09 DIAGNOSIS — Z0000 Encounter for general adult medical examination without abnormal findings: Secondary | ICD-10-CM

## 2017-06-09 DIAGNOSIS — E559 Vitamin D deficiency, unspecified: Secondary | ICD-10-CM

## 2017-06-09 DIAGNOSIS — Z1239 Encounter for other screening for malignant neoplasm of breast: Secondary | ICD-10-CM

## 2017-06-09 DIAGNOSIS — K219 Gastro-esophageal reflux disease without esophagitis: Secondary | ICD-10-CM

## 2017-06-09 DIAGNOSIS — E7800 Pure hypercholesterolemia, unspecified: Secondary | ICD-10-CM | POA: Diagnosis not present

## 2017-06-09 NOTE — Assessment & Plan Note (Addendum)
Physical today 06/09/17.  PAP 03/12/16 - negative with negative HPV.  Mammogram 04/11/16 - Birads I.  Schedule f/u mammogram.  Colonoscopy 11/25/10.  Due f/u colonoscopy.

## 2017-06-09 NOTE — Progress Notes (Signed)
Patient ID: Peggy Ward, female   DOB: 03/06/1961, 56 y.o.   MRN: [id number]   Subjective:    Patient ID: Peggy Ward, female    DOB: [birth year], 56 y.o.   MRN: [id number]  HPI  Patient here for her physical exam.  She reports she is doing relatively well.  Trying to stay active.  Walking.  No chest pain.  No sob.  No acid reflux.  No abdominal pain.  Bowels moving.  Some hot flashes at times.  Still smoking.  Discussed the need to quit.  She did agree to screening CT chest.     Past Medical History:  Diagnosis Date  . Genital warts   . GERD (gastroesophageal reflux disease)   . Hypercholesterolemia   . Vitamin D deficiency    Past Surgical History:  Procedure Laterality Date  . BREAST BIOPSY Right 1998   neg   Family History  Problem Relation Age of Onset  . Cancer Father        prostate  . Depression Father   . Heart disease Father        MI, stent  . Mental retardation Father   . Hyperlipidemia Mother   . Cancer Paternal Grandfather        prostate  . Breast cancer Neg Hx    Social History   Social History  . Marital status: Married    Spouse name: N/A  . Number of children: 2  . Years of education: N/A   Occupational History  .  [employer]   Social History Main Topics  . Smoking status: Current Every Day Smoker    Types: Cigarettes  . Smokeless tobacco: Never Used  . Alcohol use 0.0 oz/week     Comment: 3-5 glasses of wine per day  . Drug use: No  . Sexual activity: Not Asked   Other Topics Concern  . None   Social History Narrative  . None    Outpatient Encounter Prescriptions as of 06/09/2017  Medication Sig  . pantoprazole (PROTONIX) 20 MG tablet TAKE 1 TABLET (20 MG TOTAL) BY MOUTH DAILY.  Marland Kitchen Cholecalciferol (VITAMIN D3) 1000 UNITS CAPS Take 1 capsule by mouth daily.  . famciclovir (FAMVIR) 500 MG tablet Take 1 tablet (500 mg total) by mouth 2 (two) times daily as needed. (Patient not taking: Reported on 06/09/2017)  . fish oil-omega-3 fatty  acids 1000 MG capsule Take 2 g by mouth daily.  . Flaxseed, Linseed, (FLAX SEED OIL) 1000 MG CAPS Take by mouth daily.  . naproxen (NAPROSYN) 500 MG tablet Take 1 tablet (500 mg total) by mouth 2 (two) times daily with a meal. (Patient not taking: Reported on 12/15/2016)  . Vitamin D, Ergocalciferol, (DRISDOL) 50000 UNITS CAPS capsule Take 1 capsule (50,000 Units total) by mouth every 7 (seven) days. (Patient not taking: Reported on 12/15/2016)   No facility-administered encounter medications on file as of 06/09/2017.     Review of Systems  Constitutional: Negative for appetite change and unexpected weight change.  HENT: Negative for congestion and sinus pressure.   Eyes: Negative for pain and visual disturbance.  Respiratory: Negative for cough, chest tightness and shortness of breath.   Cardiovascular: Negative for chest pain, palpitations and leg swelling.  Gastrointestinal: Negative for abdominal pain, diarrhea, nausea and vomiting.  Genitourinary: Negative for difficulty urinating and dysuria.  Musculoskeletal: Negative for back pain and joint swelling.  Skin: Negative for color change and rash.  Neurological: Negative for dizziness, light-headedness and headaches.  Hematological: Negative for adenopathy. Does not bruise/bleed easily.  Psychiatric/Behavioral: Negative for agitation and dysphoric mood.       Objective:    Physical Exam  Constitutional: She is oriented to person, place, and time. She appears well-developed and well-nourished. No distress.  HENT:  Nose: Nose normal.  Mouth/Throat: Oropharynx is clear and moist.  Eyes: Right eye exhibits no discharge. Left eye exhibits no discharge. No scleral icterus.  Neck: Neck supple. No thyromegaly present.  Cardiovascular: Normal rate and regular rhythm.   Pulmonary/Chest: Breath sounds normal. No accessory muscle usage. No tachypnea. No respiratory distress. She has no decreased breath sounds. She has no wheezes. She has no  rhonchi. Right breast exhibits no inverted nipple, no mass, no nipple discharge and no tenderness (no axillary adenopathy). Left breast exhibits no inverted nipple, no mass, no nipple discharge and no tenderness (no axilarry adenopathy).  Abdominal: Soft. Bowel sounds are normal. There is no tenderness.  Musculoskeletal: She exhibits no edema or tenderness.  Lymphadenopathy:    She has no cervical adenopathy.  Neurological: She is alert and oriented to person, place, and time.  Skin: Skin is warm. No rash noted. No erythema.  Psychiatric: She has a normal mood and affect. Her behavior is normal.    BP 118/76   Pulse 72   Temp 98.5 F (36.9 C) (Oral)   Resp 14   Ht  (1.702 m)   Wt 170 lb 3.2 oz (77.2 kg)   SpO2 98%   BMI 26.66 kg/m  Wt Readings from Last 3 Encounters:  06/09/17 170 lb 3.2 oz (77.2 kg)  12/15/16 168 lb (76.2 kg)  03/12/16 163 lb 8 oz (74.2 kg)     Lab Results  Component Value Date   WBC 7.5 03/27/2016   HGB 13.3 03/27/2016   HCT 39.9 03/27/2016   PLT 266.0 03/27/2016   GLUCOSE 101 (H) 03/27/2016   CHOL 229 (H) 03/27/2016   TRIG 139.0 03/27/2016   HDL 60.80 03/27/2016   LDLDIRECT 150.0 04/02/2015   LDLCALC 140 (H) 03/27/2016   ALT 12 03/27/2016   AST 16 03/27/2016   NA 139 03/27/2016   K 4.1 03/27/2016   CL 108 03/27/2016   CREATININE 0.57 03/27/2016   BUN 13 03/27/2016   CO2 29 03/27/2016   TSH 1.37 03/27/2016    Mm Screening Breast Tomo Bilateral  Result Date: 04/11/2016 CLINICAL DATA:  Screening. EXAM: 2D DIGITAL SCREENING BILATERAL MAMMOGRAM WITH CAD AND ADJUNCT TOMO COMPARISON:  Previous exam(s). ACR Breast Density Category c: The breast tissue is heterogeneously dense, which may obscure small masses. FINDINGS: There are no findings suspicious for malignancy. Images were processed with CAD. IMPRESSION: No mammographic evidence of malignancy. A result letter of this screening mammogram will be mailed directly to the patient. RECOMMENDATION:  Screening mammogram in one year. (Code:SM-B-01Y) BI-RADS CATEGORY  1: Negative. Electronically Signed   By: Edwin Cap M.D.   On: 04/11/2016 09:57      Assessment & Plan:   Problem List Items Addressed This Visit    GERD (gastroesophageal reflux disease)    Controlled on protonix.        Health care maintenance    Physical today 06/09/17.  PAP 03/12/16 - negative with negative HPV.  Mammogram 04/11/16 - Birads I.  Schedule f/u mammogram.  Colonoscopy 11/25/10.  Due f/u colonoscopy.        Hypercholesterolemia    Low cholesterol diet and exercise.  Follow lipid panel.  Relevant Orders   CBC with Differential/Platelet   Hepatic function panel   Lipid panel   TSH   Basic metabolic panel   Tobacco abuse    Discussed the need to quit smoking.  She declines to quit.  Discussed screening CT chest.  She agrees.        Vitamin D deficiency    Follow vitamin D level.        Relevant Orders   VITAMIN D 25 Hydroxy (Vit-D Deficiency, Fractures)    Other Visit Diagnoses    Breast cancer screening    -  Primary   Relevant Orders   MM DIGITAL SCREENING BILATERAL   Colon cancer screening       Relevant Orders   Ambulatory referral to Gastroenterology   Routine general medical examination at a health care facility           Dale Prescott, MD

## 2017-06-12 ENCOUNTER — Encounter: Payer: Self-pay | Admitting: Internal Medicine

## 2017-06-12 NOTE — Assessment & Plan Note (Signed)
Low cholesterol diet and exercise.  Follow lipid panel.   

## 2017-06-12 NOTE — Assessment & Plan Note (Signed)
Discussed the need to quit smoking.  She declines to quit.  Discussed screening CT chest.  She agrees.

## 2017-06-12 NOTE — Assessment & Plan Note (Signed)
Follow vitamin D level.  

## 2017-06-12 NOTE — Assessment & Plan Note (Signed)
Controlled on protonix.   

## 2017-06-16 ENCOUNTER — Telehealth: Payer: Self-pay | Admitting: *Deleted

## 2017-06-16 NOTE — Telephone Encounter (Signed)
Received referral for low dose lung cancer screening CT scan. Message left at phone number listed in EMR for patient to call me back to facilitate scheduling scan.  

## 2017-06-18 ENCOUNTER — Other Ambulatory Visit: Payer: Self-pay | Admitting: Internal Medicine

## 2017-06-18 ENCOUNTER — Ambulatory Visit
Admission: RE | Admit: 2017-06-18 | Discharge: 2017-06-18 | Disposition: A | Discharge status: Home / Self Care | Payer: Commercial Managed Care - PPO | Source: Ambulatory Visit | Attending: Internal Medicine | Admitting: Internal Medicine

## 2017-06-18 DIAGNOSIS — Z1239 Encounter for other screening for malignant neoplasm of breast: Secondary | ICD-10-CM

## 2017-06-18 DIAGNOSIS — Z1231 Encounter for screening mammogram for malignant neoplasm of breast: Secondary | ICD-10-CM | POA: Diagnosis not present

## 2017-06-24 ENCOUNTER — Telehealth: Payer: Self-pay | Admitting: *Deleted

## 2017-06-24 NOTE — Telephone Encounter (Signed)
Received referral for low dose lung cancer screening CT scan. Message left at phone number listed in EMR for patient to call me back to facilitate scheduling scan.  

## 2017-06-25 ENCOUNTER — Other Ambulatory Visit (INDEPENDENT_AMBULATORY_CARE_PROVIDER_SITE_OTHER): Payer: Commercial Managed Care - PPO

## 2017-06-25 DIAGNOSIS — E559 Vitamin D deficiency, unspecified: Secondary | ICD-10-CM | POA: Diagnosis not present

## 2017-06-25 DIAGNOSIS — E7800 Pure hypercholesterolemia, unspecified: Secondary | ICD-10-CM

## 2017-06-25 LAB — HEPATIC FUNCTION PANEL
ALT: 16 U/L (ref 0–35)
AST: 20 U/L (ref 0–37)
Albumin: 4.5 g/dL (ref 3.5–5.2)
Alkaline Phosphatase: 61 U/L (ref 39–117)
BILIRUBIN DIRECT: 0.1 mg/dL (ref 0.0–0.3)
BILIRUBIN TOTAL: 0.3 mg/dL (ref 0.2–1.2)
TOTAL PROTEIN: 7.2 g/dL (ref 6.0–8.3)

## 2017-06-25 LAB — CBC WITH DIFFERENTIAL/PLATELET
BASOS PCT: 0.9 % (ref 0.0–3.0)
Basophils Absolute: 0.1 10*3/uL (ref 0.0–0.1)
Eosinophils Absolute: 0.1 10*3/uL (ref 0.0–0.7)
Eosinophils Relative: 1.5 % (ref 0.0–5.0)
HCT: 41.3 % (ref 36.0–46.0)
Hemoglobin: 13.7 g/dL (ref 12.0–15.0)
LYMPHS ABS: 2 10*3/uL (ref 0.7–4.0)
Lymphocytes Relative: 33.4 % (ref 12.0–46.0)
MCHC: 33.1 g/dL (ref 30.0–36.0)
MCV: 101.3 fl — ABNORMAL HIGH (ref 78.0–100.0)
MONO ABS: 0.4 10*3/uL (ref 0.1–1.0)
Monocytes Relative: 7.2 % (ref 3.0–12.0)
NEUTROS ABS: 3.5 10*3/uL (ref 1.4–7.7)
NEUTROS PCT: 57 % (ref 43.0–77.0)
PLATELETS: 254 10*3/uL (ref 150.0–400.0)
RBC: 4.08 Mil/uL (ref 3.87–5.11)
RDW: 12.9 % (ref 11.5–15.5)
WBC: 6.1 10*3/uL (ref 4.0–10.5)

## 2017-06-25 LAB — BASIC METABOLIC PANEL
BUN: 11 mg/dL (ref 6–23)
CALCIUM: 8.8 mg/dL (ref 8.4–10.5)
CO2: 30 mEq/L (ref 19–32)
Chloride: 105 mEq/L (ref 96–112)
Creatinine, Ser: 0.62 mg/dL (ref 0.40–1.20)
GFR: 105.35 mL/min (ref >60.00)
Glucose, Bld: 92 mg/dL (ref 70–99)
Potassium: 4.1 mEq/L (ref 3.5–5.1)
SODIUM: 140 meq/L (ref 135–145)

## 2017-06-25 LAB — LIPID PANEL
CHOL/HDL RATIO: 4
Cholesterol: 228 mg/dL — ABNORMAL HIGH (ref 0–200)
HDL: 51.6 mg/dL (ref >39.00)
LDL Cholesterol: 139 mg/dL — ABNORMAL HIGH (ref 0–99)
NONHDL: 176.78
Triglycerides: 189 mg/dL — ABNORMAL HIGH (ref 0.0–149.0)
VLDL: 37.8 mg/dL (ref 0.0–40.0)

## 2017-06-25 LAB — VITAMIN D 25 HYDROXY (VIT D DEFICIENCY, FRACTURES): VITD: 15.29 ng/mL — AB (ref 30.00–100.00)

## 2017-06-25 LAB — TSH: TSH: 1.04 u[IU]/mL (ref 0.35–4.50)

## 2017-06-26 ENCOUNTER — Other Ambulatory Visit (INDEPENDENT_AMBULATORY_CARE_PROVIDER_SITE_OTHER): Payer: Commercial Managed Care - PPO

## 2017-06-26 ENCOUNTER — Other Ambulatory Visit: Payer: Self-pay | Admitting: Internal Medicine

## 2017-06-26 DIAGNOSIS — D7589 Other specified diseases of blood and blood-forming organs: Secondary | ICD-10-CM | POA: Diagnosis not present

## 2017-06-26 LAB — VITAMIN B12: VITAMIN B 12: 126 pg/mL — AB (ref 211–911)

## 2017-06-26 NOTE — Progress Notes (Signed)
Order placed for B12 add on lab.  

## 2017-06-30 ENCOUNTER — Telehealth: Payer: Self-pay

## 2017-07-02 NOTE — Telephone Encounter (Signed)
Error

## 2017-07-06 ENCOUNTER — Telehealth: Payer: Self-pay | Admitting: *Deleted

## 2017-07-06 NOTE — Telephone Encounter (Signed)
Received referral for low dose lung cancer screening CT scan. Message left at phone number listed in EMR for patient to call me back to facilitate scheduling scan.  

## 2017-07-09 ENCOUNTER — Ambulatory Visit (INDEPENDENT_AMBULATORY_CARE_PROVIDER_SITE_OTHER): Payer: Commercial Managed Care - PPO | Admitting: *Deleted

## 2017-07-09 DIAGNOSIS — E538 Deficiency of other specified B group vitamins: Secondary | ICD-10-CM | POA: Diagnosis not present

## 2017-07-09 MED ORDER — CYANOCOBALAMIN 1000 MCG/ML IJ SOLN
1000.0000 ug | Freq: Once | INTRAMUSCULAR | Status: AC
  Administered 2017-07-09: 1000 ug via INTRAMUSCULAR

## 2017-07-09 NOTE — Progress Notes (Signed)
Left message for patient to return cal to office 

## 2017-07-09 NOTE — Progress Notes (Addendum)
Patient presented for B 12 injection to right deltoid, patient voiced no concerns nor showed any signs of distress during injection.  Patient presented for the first of four weekly injections of B 12, patient ask if she could get a prescription for B 12 to be given at [employer] where she works so will not have to come to office for injections.  Reviewed.  I am ok with sending in rx for her, but she needs to make sure person given knows proper way to give.  Also, make sure she is aware of the dosing regimen.  Thanks    Dr Lorin PicketScott

## 2017-07-09 NOTE — Progress Notes (Signed)
Signed off on note.  Need to make sure person giving knows correct way to give and also make sure she knows correct dosing instructions.

## 2017-07-10 NOTE — Progress Notes (Signed)
Left message to return cal to office. 

## 2017-07-14 NOTE — Progress Notes (Signed)
Patient called back and staed will an RN giving injections ok to send  B 12 and in right?

## 2017-07-15 MED ORDER — CYANOCOBALAMIN 1000 MCG/ML IJ KIT: 1.0000 mL | PACK | INTRAMUSCULAR | 2 refills | Status: DC

## 2017-07-15 NOTE — Progress Notes (Signed)
Ok to send in. Thanks

## 2017-07-15 NOTE — Progress Notes (Signed)
Order sent to pharmacy  

## 2017-07-15 NOTE — Addendum Note (Signed)
Addended by: Dennie BibleAVIS, KATHY R on: 07/15/2017 09:17 AM   Modules accepted: Orders

## 2017-07-16 ENCOUNTER — Ambulatory Visit: Payer: Commercial Managed Care - PPO

## 2017-07-17 ENCOUNTER — Other Ambulatory Visit: Payer: Self-pay | Admitting: Internal Medicine

## 2017-07-17 MED ORDER — CYANOCOBALAMIN 1000 MCG/ML IJ SOLN: 1000.0000 ug | INTRAMUSCULAR | 2 refills | Status: DC

## 2017-07-17 NOTE — Telephone Encounter (Signed)
No was sent in after Dr. Lorin PicketScott gave approval I just finished talking with patient she did not understand directions. New script sent to pharmacy.

## 2017-07-17 NOTE — Telephone Encounter (Signed)
This patient had first shot here in the office, she was requesting that she do the others at [facility] did this get taken care of at the visit?

## 2017-07-17 NOTE — Telephone Encounter (Signed)
Copied from CRM [id number]. Topic: Quick Communication - See Telephone Encounter >> Jul 17, 2017  8:32 AM Guinevere FerrariMorris, Jury Caserta E, NT wrote: CRM for notification. See Telephone encounter for:  07/17/17. Pt. Called in about missing the second third and fourth week of the B12. [facility] [address] [facility] [place]. Pt. Would like a call back

## 2017-07-23 ENCOUNTER — Ambulatory Visit: Payer: Commercial Managed Care - PPO

## 2017-07-30 ENCOUNTER — Ambulatory Visit: Payer: Commercial Managed Care - PPO

## 2017-08-04 ENCOUNTER — Encounter: Payer: Self-pay | Admitting: *Deleted

## 2017-11-05 ENCOUNTER — Ambulatory Visit (INDEPENDENT_AMBULATORY_CARE_PROVIDER_SITE_OTHER): Payer: Commercial Managed Care - PPO | Admitting: Family Medicine

## 2017-11-05 ENCOUNTER — Encounter: Payer: Self-pay | Admitting: Family Medicine

## 2017-11-05 VITALS — BP 108/82 | HR 87 | Temp 98.7°F | Resp 12 | Wt 171.5 lb

## 2017-11-05 DIAGNOSIS — H66001 Acute suppurative otitis media without spontaneous rupture of ear drum, right ear: Secondary | ICD-10-CM | POA: Diagnosis not present

## 2017-11-05 DIAGNOSIS — R52 Pain, unspecified: Secondary | ICD-10-CM | POA: Diagnosis not present

## 2017-11-05 DIAGNOSIS — R42 Dizziness and giddiness: Secondary | ICD-10-CM | POA: Diagnosis not present

## 2017-11-05 DIAGNOSIS — J0140 Acute pansinusitis, unspecified: Secondary | ICD-10-CM

## 2017-11-05 LAB — POC INFLUENZA A&B (BINAX/QUICKVUE)
INFLUENZA A, POC: NEGATIVE
Influenza B, POC: NEGATIVE

## 2017-11-05 MED ORDER — AMOXICILLIN-POT CLAVULANATE 875-125 MG PO TABS: 1.0000 | ORAL_TABLET | Freq: Two times a day (BID) | ORAL | 0 refills | Status: DC

## 2017-11-05 MED ORDER — BENZONATATE 100 MG PO CAPS: 100.0000 mg | ORAL_CAPSULE | Freq: Three times a day (TID) | ORAL | 0 refills | Status: DC

## 2017-11-05 NOTE — Progress Notes (Signed)
Patient ID: Peggy Ward, female   DOB: 1961-09-02, 57 y.o.   MRN: [id number] PCP: Einar Pheasant, MD  Subjective:  Peggy Ward is a 57 y.o. year old very pleasant female patient who presents with  symptoms including nasal congestion, sore throat that has improved, cough that is nonproductive, rhinitis with yellow drainage, right ear pain/pressure, intermittent dizziness that is not present today, sinus pressure, and myalgias. -started: 6 days ago , symptoms are worsening -previous treatments: Robitussin and Tylenol have provided limited benefit -sick contacts/travel/risks: denies flu exposure. Recent sick contact exposure at work at [employer], flu exposure. Influenza vaccine is UTD -Hx of: seasonal allergies Current smoker with approximately 8 cigarettes/day.  No recent antibiotic use   ROS-denies fever, SOB, NVD, tooth pain  Pertinent Past Medical History-  GERD  Medications- reviewed  Current Outpatient Medications  Medication Sig Dispense Refill  . Cholecalciferol (VITAMIN D3) 1000 UNITS CAPS Take 1 capsule by mouth daily.    . cyanocobalamin (,VITAMIN B-12,) 1000 MCG/ML injection Inject 1 mL (1,000 mcg total) into the muscle once a week. For 3 weeks then switch to once  monthly every 30 days. 10 mL 2  . Cyanocobalamin 1000 MCG/ML KIT Inject 1 mL as directed every 30 (thirty) days. 3 kit 2  . famciclovir (FAMVIR) 500 MG tablet Take 1 tablet (500 mg total) by mouth 2 (two) times daily as needed. (Patient not taking: Reported on 06/09/2017) 30 tablet 0  . fish oil-omega-3 fatty acids 1000 MG capsule Take 2 g by mouth daily.    . Flaxseed, Linseed, (FLAX SEED OIL) 1000 MG CAPS Take by mouth daily.    . naproxen (NAPROSYN) 500 MG tablet Take 1 tablet (500 mg total) by mouth 2 (two) times daily with a meal. (Patient not taking: Reported on 12/15/2016) 20 tablet 0  . pantoprazole (PROTONIX) 20 MG tablet TAKE 1 TABLET (20 MG TOTAL) BY MOUTH DAILY. 30 tablet 2  . Vitamin D,  Ergocalciferol, (DRISDOL) 50000 UNITS CAPS capsule Take 1 capsule (50,000 Units total) by mouth every 7 (seven) days. (Patient not taking: Reported on 12/15/2016) 8 capsule 0   No current facility-administered medications for this visit.     Objective: LMP 03/21/2011  Gen: NAD, resting comfortably HEENT: Turbinates erythematous, Left TM normal, Right TM erythematous and bulging, no rupture , pharynx mildly erythematous with no tonsilar exudate or edema, + maxillary and frontal  sinus tenderness CV: RRR no murmurs rubs or gallops Lungs: CTAB no crackles, wheeze, rhonchi Abdomen: soft/nontender/nondistended/normal bowel sounds. No rebound or guarding.  Ext: no edema Skin: warm, dry, no rash Neuro:  II-Visual fields grossly intact. III/IV/VI-Extraocular movements intact. Pupils reactive bilaterally. V/VII-Smile symmetric, equal eyebrow raise, facial sensation intact VIII- Hearing grossly intact XI-bilateral shoulder shrug XII-midline tongue extension Motor: 5/5 bilaterally with normal tone and bulk Ambulates with a coordinated gait  Assessment/Plan: 1. Acute suppurative otitis media of right ear without spontaneous rupture of tympanic membrane, recurrence not specified Exam and history support treatment for AOM; Augmentin chosen due to significant sinusitis symptoms that are also present.  Advised patient on supportive measures:  Get rest, drink plenty of fluids, and use tylenol as needed for pain. Benzonatate for cough was provided. She will add either Allegra, Claritin, or Zyrtec if needed for sneezing and post nasal drip. Follow up if fever >101, if symptoms worsen or if symptoms are not improving in 3 to 4 days. Patient verbalizes understanding.     2. Acute pansinusitis, recurrence not specified URI symptoms  have persisted for 6 days and AOM present; treat with Augmentin, see above  3. 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  - POC Influenza A&B(BINAX/QUICKVUE)  4. Dizziness Neuro exam WNL; no dizziness today; suspect that symptom most likely related to AOM and sinusitis. Advised follow up if symptom persists or worsens. She voiced understanding and agreed with plan.   Finally, we reviewed reasons to return to care including if symptoms worsen or persist or new concerns arise- once again particularly shortness of breath or fever.    Laurita Quint, FNP

## 2017-11-05 NOTE — Patient Instructions (Signed)
Please take medication as directed with food. You can also use a probiotic while taking your antibiotic and two week after. Please drink plenty of water so that your urine is pale yellow or clear. Also, get plenty of rest, use tylenol as needed for discomfort and follow up if symptoms do not improve in 3 to 4 days, worsen, or you develop a fever >101.  You can either Allegra, Claritin, or Zyrtec for sneezing symptoms as discussed.   Sinusitis, Adult Sinusitis is soreness and inflammation of your sinuses. Sinuses are hollow spaces in the bones around your face. They are located:  Around your eyes.  In the middle of your forehead.  Behind your nose.  In your cheekbones.  Your sinuses and nasal passages are lined with a stringy fluid (mucus). Mucus normally drains out of your sinuses. When your nasal tissues get inflamed or swollen, the mucus can get trapped or blocked so air cannot flow through your sinuses. This lets bacteria, viruses, and funguses grow, and that leads to infection. Follow these instructions at home: Medicines  Take, use, or apply over-the-counter and prescription medicines only as told by your doctor. These may include nasal sprays.  If you were prescribed an antibiotic medicine, take it as told by your doctor. Do not stop taking the antibiotic even if you start to feel better. Hydrate and Humidify  Drink enough water to keep your pee (urine) clear or pale yellow.  Use a cool mist humidifier to keep the humidity level in your home above 50%.  Breathe in steam for 10-15 minutes, 3-4 times a day or as told by your doctor. You can do this in the bathroom while a hot shower is running.  Try not to spend time in cool or dry air. Rest  Rest as much as possible.  Sleep with your head raised (elevated).  Make sure to get enough sleep each night. General instructions  Put a warm, moist washcloth on your face 3-4 times a day or as told by your doctor. This will help  with discomfort.  Wash your hands often with soap and water. If there is no soap and water, use hand sanitizer.  Do not smoke. Avoid being around people who are smoking (secondhand smoke).  Keep all follow-up visits as told by your doctor. This is important. Contact a doctor if:  You have a fever.  Your symptoms get worse.  Your symptoms do not get better within 10 days. Get help right away if:  You have a very bad headache.  You cannot stop throwing up (vomiting).  You have pain or swelling around your face or eyes.  You have trouble seeing.  You feel confused.  Your neck is stiff.  You have trouble breathing. This information is not intended to replace advice given to you by your health care provider. Make sure you discuss any questions you have with your health care provider. Document Released: 02/18/2008 Document Revised: 04/27/2016 Document Reviewed: 06/27/2015 Elsevier Interactive Patient Education  [employer]2018 Elsevier Inc.

## 2017-12-09 ENCOUNTER — Telehealth: Payer: Self-pay | Admitting: Internal Medicine

## 2017-12-09 NOTE — Telephone Encounter (Signed)
Refill  Request  protonix    20  Mg  Last  Filled   03/07/2014  LOV  11/05/2013 for  Acute  suppurative  Otitis media   [facility] [address]   [place]   [place]

## 2017-12-09 NOTE — Telephone Encounter (Signed)
Copied from CRM [id number]. Topic: Quick Communication - Rx Refill/Question >> Dec 09, 2017 11:03 AM Cipriano BunkerLambe, Annette S wrote:  Medication: pantoprazole (PROTONIX) 20 MG tablet   Has the patient contacted their pharmacy? Yes.    (Agent: If no, request that the patient contact the pharmacy for the refill.) Preferred Pharmacy (with phone number or street name):   [id number] [place],  - [address] [place] [address] Phone: [phone] Fax: [phone]  Agent: Please be advised that RX refills may take up to 3 business days. We ask that you follow-up with your pharmacy.

## 2017-12-10 ENCOUNTER — Ambulatory Visit: Payer: Commercial Managed Care - PPO | Admitting: Internal Medicine

## 2017-12-11 NOTE — Telephone Encounter (Signed)
Left message for patient to call back  

## 2017-12-11 NOTE — Telephone Encounter (Signed)
Returned call. Please advise

## 2017-12-11 NOTE — Telephone Encounter (Signed)
Returning call, called ofc, no answer

## 2017-12-14 NOTE — Telephone Encounter (Signed)
Left message for patient to call back to confirm how she is taking since she has not filled since 2015

## 2017-12-15 MED ORDER — PANTOPRAZOLE SODIUM 20 MG PO TBEC: DELAYED_RELEASE_TABLET | ORAL | 1 refills | Status: DC

## 2017-12-15 NOTE — Telephone Encounter (Signed)
Spoke with you about this earlier

## 2017-12-15 NOTE — Telephone Encounter (Signed)
Patient is calling and states she takes it PRN and has ran out, she states it is good to have when she needs it. CB# [phone]  [id number] [place] [place] - [address]  [address] [place] [address]  Phone: [phone] Fax: [phone]

## 2017-12-15 NOTE — Telephone Encounter (Signed)
Please advise 

## 2017-12-15 NOTE — Telephone Encounter (Signed)
rx ok'd to have.  If any worsening symptoms, let us know and will need to be evaluated.

## 2017-12-15 NOTE — Telephone Encounter (Signed)
Patient aware.

## 2017-12-18 LAB — HM COLONOSCOPY

## 2018-05-11 ENCOUNTER — Encounter: Payer: Self-pay | Admitting: Internal Medicine

## 2018-05-11 ENCOUNTER — Ambulatory Visit (INDEPENDENT_AMBULATORY_CARE_PROVIDER_SITE_OTHER): Payer: Commercial Managed Care - PPO | Admitting: Internal Medicine

## 2018-05-11 VITALS — BP 118/76 | HR 68 | Temp 97.9°F | Resp 18 | Ht 67.0 in | Wt 168.8 lb

## 2018-05-11 DIAGNOSIS — Z1239 Encounter for other screening for malignant neoplasm of breast: Secondary | ICD-10-CM

## 2018-05-11 DIAGNOSIS — Z0000 Encounter for general adult medical examination without abnormal findings: Secondary | ICD-10-CM | POA: Diagnosis not present

## 2018-05-11 DIAGNOSIS — Z720 Tobacco use: Secondary | ICD-10-CM

## 2018-05-11 DIAGNOSIS — E7800 Pure hypercholesterolemia, unspecified: Secondary | ICD-10-CM

## 2018-05-11 DIAGNOSIS — Z1231 Encounter for screening mammogram for malignant neoplasm of breast: Secondary | ICD-10-CM

## 2018-05-11 DIAGNOSIS — F439 Reaction to severe stress, unspecified: Secondary | ICD-10-CM

## 2018-05-11 DIAGNOSIS — E559 Vitamin D deficiency, unspecified: Secondary | ICD-10-CM | POA: Diagnosis not present

## 2018-05-11 DIAGNOSIS — K219 Gastro-esophageal reflux disease without esophagitis: Secondary | ICD-10-CM

## 2018-05-11 LAB — LIPID PANEL
CHOL/HDL RATIO: 4
CHOLESTEROL: 216 mg/dL — AB (ref 0–200)
HDL: 55.9 mg/dL (ref >39.00)
NonHDL: 160.35
Triglycerides: 276 mg/dL — ABNORMAL HIGH (ref 0.0–149.0)
VLDL: 55.2 mg/dL — AB (ref 0.0–40.0)

## 2018-05-11 LAB — CBC WITH DIFFERENTIAL/PLATELET
BASOS ABS: 0 10*3/uL (ref 0.0–0.1)
Basophils Relative: 0.7 % (ref 0.0–3.0)
Eosinophils Absolute: 0.1 10*3/uL (ref 0.0–0.7)
Eosinophils Relative: 2.4 % (ref 0.0–5.0)
HCT: 39 % (ref 36.0–46.0)
Hemoglobin: 13.2 g/dL (ref 12.0–15.0)
LYMPHS ABS: 1.9 10*3/uL (ref 0.7–4.0)
Lymphocytes Relative: 33.3 % (ref 12.0–46.0)
MCHC: 33.8 g/dL (ref 30.0–36.0)
MCV: 97.6 fl (ref 78.0–100.0)
Monocytes Absolute: 0.4 10*3/uL (ref 0.1–1.0)
Monocytes Relative: 6.9 % (ref 3.0–12.0)
NEUTROS ABS: 3.1 10*3/uL (ref 1.4–7.7)
Neutrophils Relative %: 56.7 % (ref 43.0–77.0)
PLATELETS: 237 10*3/uL (ref 150.0–400.0)
RBC: 4 Mil/uL (ref 3.87–5.11)
RDW: 12.9 % (ref 11.5–15.5)
WBC: 5.6 10*3/uL (ref 4.0–10.5)

## 2018-05-11 LAB — TSH: TSH: 1.61 u[IU]/mL (ref 0.35–4.50)

## 2018-05-11 LAB — COMPREHENSIVE METABOLIC PANEL
ALT: 20 U/L (ref 0–35)
AST: 25 U/L (ref 0–37)
Albumin: 4.3 g/dL (ref 3.5–5.2)
Alkaline Phosphatase: 65 U/L (ref 39–117)
BILIRUBIN TOTAL: 0.4 mg/dL (ref 0.2–1.2)
BUN: 13 mg/dL (ref 6–23)
CHLORIDE: 104 meq/L (ref 96–112)
CO2: 31 meq/L (ref 19–32)
Calcium: 9.4 mg/dL (ref 8.4–10.5)
Creatinine, Ser: 0.65 mg/dL (ref 0.40–1.20)
GFR: 99.45 mL/min (ref >60.00)
GLUCOSE: 89 mg/dL (ref 70–99)
Potassium: 4 mEq/L (ref 3.5–5.1)
Sodium: 141 mEq/L (ref 135–145)
Total Protein: 7.5 g/dL (ref 6.0–8.3)

## 2018-05-11 LAB — VITAMIN D 25 HYDROXY (VIT D DEFICIENCY, FRACTURES): VITD: 13.37 ng/mL — AB (ref 30.00–100.00)

## 2018-05-11 LAB — LDL CHOLESTEROL, DIRECT: LDL DIRECT: 133 mg/dL

## 2018-05-11 NOTE — Progress Notes (Signed)
Patient ID: Peggy Ward, female   DOB: 1961-06-01, 57 y.o.   MRN: [id number]   Subjective:    Patient ID: Peggy Ward, female    DOB: [birth year], 57 y.o.   MRN: [id number]  HPI  Patient here for her physical exam.  She reports she is doing relatively well.  Trying to stay active.  No chest pain.  No sob.  No acid reflux.  No abdominal pain.  Bowels moving.  No urine change.  Handling stress ok.  Colonoscopy 12/19/17.  Recommended f/u in 5 years.     Past Medical History:  Diagnosis Date  . Genital warts   . GERD (gastroesophageal reflux disease)   . Hypercholesterolemia   . Vitamin D deficiency    Past Surgical History:  Procedure Laterality Date  . BREAST EXCISIONAL BIOPSY Right 1998   neg   Family History  Problem Relation Age of Onset  . Cancer Father        prostate  . Depression Father   . Heart disease Father        MI, stent  . Mental retardation Father   . Hyperlipidemia Mother   . Cancer Paternal Grandfather        prostate  . Breast cancer Neg Hx    Social History   Socioeconomic History  . Marital status: Married    Spouse name: Not on file  . Number of children: 2  . Years of education: Not on file  . Highest education level: Not on file  Occupational History    Employer: [employer]  Social Needs  . Financial resource strain: Not on file  . Food insecurity:    Worry: Not on file    Inability: Not on file  . Transportation needs:    Medical: Not on file    Non-medical: Not on file  Tobacco Use  . Smoking status: Current Every Day Smoker    Types: Cigarettes  . Smokeless tobacco: Never Used  Substance and Sexual Activity  . Alcohol use: Yes    Alcohol/week: 0.0 standard drinks    Comment: 3-5 glasses of wine per day  . Drug use: No  . Sexual activity: Not on file  Lifestyle  . Physical activity:    Days per week: Not on file    Minutes per session: Not on file  . Stress: Not on file  Relationships  . Social connections:    Talks on  phone: Not on file    Gets together: Not on file    Attends religious service: Not on file    Active member of club or organization: Not on file    Attends meetings of clubs or organizations: Not on file    Relationship status: Not on file  Other Topics Concern  . Not on file  Social History Narrative  . Not on file    Outpatient Encounter Medications as of 05/11/2018  Medication Sig  . pantoprazole (PROTONIX) 20 MG tablet TAKE 1 TABLET (20 MG TOTAL) BY MOUTH DAILY.  . [DISCONTINUED] amoxicillin-clavulanate (AUGMENTIN) 875-125 MG tablet Take 1 tablet by mouth 2 (two) times daily.  . [DISCONTINUED] benzonatate (TESSALON) 100 MG capsule Take 1 capsule (100 mg total) by mouth 3 (three) times daily.  . [DISCONTINUED] Cholecalciferol (VITAMIN D3) 1000 UNITS CAPS Take 1 capsule by mouth daily.  . [DISCONTINUED] cyanocobalamin (,VITAMIN B-12,) 1000 MCG/ML injection Inject 1 mL (1,000 mcg total) into the muscle once a week. For 3 weeks then switch to once  monthly every 30 days.  . [DISCONTINUED] Cyanocobalamin 1000 MCG/ML KIT Inject 1 mL as directed every 30 (thirty) days.  . [DISCONTINUED] famciclovir (FAMVIR) 500 MG tablet Take 1 tablet (500 mg total) by mouth 2 (two) times daily as needed. (Patient not taking: Reported on 06/09/2017)  . [DISCONTINUED] fish oil-omega-3 fatty acids 1000 MG capsule Take 2 g by mouth daily.  . [DISCONTINUED] Flaxseed, Linseed, (Ward SEED OIL) 1000 MG CAPS Take by mouth daily.  . [DISCONTINUED] naproxen (NAPROSYN) 500 MG tablet Take 1 tablet (500 mg total) by mouth 2 (two) times daily with a meal. (Patient not taking: Reported on 12/15/2016)  . [DISCONTINUED] Vitamin D, Ergocalciferol, (DRISDOL) 50000 UNITS CAPS capsule Take 1 capsule (50,000 Units total) by mouth every 7 (seven) days. (Patient not taking: Reported on 12/15/2016)   No facility-administered encounter medications on file as of 05/11/2018.     Review of Systems  Constitutional: Negative for appetite  change and unexpected weight change.  HENT: Negative for congestion and sinus pressure.   Eyes: Negative for pain and visual disturbance.  Respiratory: Negative for cough, chest tightness and shortness of breath.   Cardiovascular: Negative for chest pain, palpitations and leg swelling.  Gastrointestinal: Negative for abdominal pain, diarrhea, nausea and vomiting.  Genitourinary: Negative for difficulty urinating and dysuria.  Musculoskeletal: Negative for joint swelling and myalgias.  Skin: Negative for color change and rash.  Neurological: Negative for dizziness, light-headedness and headaches.  Hematological: Negative for adenopathy. Does not bruise/bleed easily.  Psychiatric/Behavioral: Negative for agitation and dysphoric mood.       Objective:    Physical Exam  Constitutional: She is oriented to person, place, and time. She appears well-developed and well-nourished. No distress.  HENT:  Nose: Nose normal.  Mouth/Throat: Oropharynx is clear and moist.  Eyes: Right eye exhibits no discharge. Left eye exhibits no discharge. No scleral icterus.  Neck: Neck supple. No thyromegaly present.  Cardiovascular: Normal rate and regular rhythm.  Pulmonary/Chest: Breath sounds normal. No accessory muscle usage. No tachypnea. No respiratory distress. She has no decreased breath sounds. She has no wheezes. She has no rhonchi. Right breast exhibits no inverted nipple, no mass, no nipple discharge and no tenderness (no axillary adenopathy). Left breast exhibits no inverted nipple, no mass, no nipple discharge and no tenderness (no axilarry adenopathy).  Abdominal: Soft. Bowel sounds are normal. There is no tenderness.  Musculoskeletal: She exhibits no edema or tenderness.  Lymphadenopathy:    She has no cervical adenopathy.  Neurological: She is alert and oriented to person, place, and time.  Skin: No rash noted. No erythema.  Psychiatric: She has a normal mood and affect. Her behavior is normal.      BP 118/76 (BP Location: Left Arm, Patient Position: Sitting, Cuff Size: Normal)   Pulse 68   Temp 97.9 F (36.6 C) (Oral)   Resp 18   Ht 5' 7"  (1.702 m)   Wt 168 lb 12.8 oz (76.6 kg)   LMP 03/21/2011   SpO2 98%   BMI 26.44 kg/m  Wt Readings from Last 3 Encounters:  05/11/18 168 lb 12.8 oz (76.6 kg)  11/05/17 171 lb 8 oz (77.8 kg)  06/09/17 170 lb 3.2 oz (77.2 kg)     Lab Results  Component Value Date   WBC 5.6 05/11/2018   HGB 13.2 05/11/2018   HCT 39.0 05/11/2018   PLT 237.0 05/11/2018   GLUCOSE 89 05/11/2018   CHOL 216 (H) 05/11/2018   TRIG 276.0 (H) 05/11/2018  HDL 55.90 05/11/2018   LDLDIRECT 133.0 05/11/2018   LDLCALC 139 (H) 06/25/2017   ALT 20 05/11/2018   AST 25 05/11/2018   NA 141 05/11/2018   K 4.0 05/11/2018   CL 104 05/11/2018   CREATININE 0.65 05/11/2018   BUN 13 05/11/2018   CO2 31 05/11/2018   TSH 1.61 05/11/2018    Mm Screening Breast Tomo Bilateral  Result Date: 06/18/2017 CLINICAL DATA:  Screening. EXAM: 2D DIGITAL SCREENING BILATERAL MAMMOGRAM WITH CAD AND ADJUNCT TOMO COMPARISON:  Previous exam(s). ACR Breast Density Category c: The breast tissue is heterogeneously dense, which may obscure small masses. FINDINGS: There are no findings suspicious for malignancy. Images were processed with CAD. IMPRESSION: No mammographic evidence of malignancy. A result letter of this screening mammogram will be mailed directly to the patient. RECOMMENDATION: Screening mammogram in one year. (Code:SM-B-01Y) BI-RADS CATEGORY  1: Negative. Electronically Signed   By: Claudie Revering M.D.   On: 06/18/2017 15:00       Assessment & Plan:   Problem List Items Addressed This Visit    GERD (gastroesophageal reflux disease)    Controlled on current medication.  Follow.        Health care maintenance    Physical today 05/11/18.  PAP 03/12/16 - negative with negative HPV.  Mammogram 06/18/17 - Birads I.  Colonoscopy - states just had colonoscopy.  Will obtain results    Addendum:  Polypectomy.  Recommended f/u in 5 years.        Hypercholesterolemia    Low cholesterol diet and exercise.  Follow lipid panel.        Relevant Orders   CBC with Differential/Platelet (Completed)   Comprehensive metabolic panel (Completed)   TSH (Completed)   Lipid panel (Completed)   Stress    Increased stress.  Discussed with her today.  She does not feel needs any further intervention.  Follow.        Tobacco abuse    Discussed the need to quit smoking.  Discussed screening CT chest.  Wants to hold at this time,  Will notify me when agreeable.           `      Vitamin D deficiency   Relevant Orders   VITAMIN D 25 Hydroxy (Vit-D Deficiency, Fractures) (Completed)    Other Visit Diagnoses    Routine general medical examination at a health care facility    -  Primary   Breast cancer screening       Relevant Orders   MM 3D SCREEN BREAST BILATERAL       Einar Pheasant, MD

## 2018-05-11 NOTE — Assessment & Plan Note (Addendum)
Physical today 05/11/18.  PAP 03/12/16 - negative with negative HPV.  Mammogram 06/18/17 - Birads I.  Colonoscopy - states just had colonoscopy.  Will obtain results   Addendum:  Polypectomy.  Recommended f/u in 5 years.

## 2018-05-14 ENCOUNTER — Other Ambulatory Visit: Payer: Self-pay

## 2018-05-14 MED ORDER — VITAMIN D (ERGOCALCIFEROL) 1.25 MG (50000 UNIT) PO CAPS: 50000.0000 [IU] | ORAL_CAPSULE | ORAL | 0 refills | Status: DC

## 2018-05-16 ENCOUNTER — Encounter: Payer: Self-pay | Admitting: Internal Medicine

## 2018-05-16 DIAGNOSIS — F439 Reaction to severe stress, unspecified: Secondary | ICD-10-CM | POA: Insufficient documentation

## 2018-05-16 NOTE — Assessment & Plan Note (Signed)
Increased stress.  Discussed with her today. She does not feel needs any further intervention.  Follow.   

## 2018-05-16 NOTE — Assessment & Plan Note (Signed)
Low cholesterol diet and exercise.  Follow lipid panel.   

## 2018-05-16 NOTE — Assessment & Plan Note (Signed)
Discussed the need to quit smoking.  Discussed screening CT chest.  Wants to hold at this time,  Will notify me when agreeable.           `

## 2018-05-16 NOTE — Assessment & Plan Note (Signed)
Controlled on current medication.  Follow.   

## 2018-06-11 ENCOUNTER — Encounter: Payer: Self-pay | Admitting: Internal Medicine

## 2018-06-14 ENCOUNTER — Other Ambulatory Visit: Payer: Self-pay | Admitting: Internal Medicine

## 2018-07-16 ENCOUNTER — Ambulatory Visit
Admission: RE | Admit: 2018-07-16 | Discharge: 2018-07-16 | Disposition: A | Discharge status: Home / Self Care | Payer: Commercial Managed Care - PPO | Source: Ambulatory Visit | Attending: Internal Medicine | Admitting: Internal Medicine

## 2018-07-16 DIAGNOSIS — Z1239 Encounter for other screening for malignant neoplasm of breast: Secondary | ICD-10-CM | POA: Diagnosis not present

## 2018-07-18 ENCOUNTER — Other Ambulatory Visit: Payer: Self-pay | Admitting: Internal Medicine

## 2018-07-21 ENCOUNTER — Other Ambulatory Visit: Payer: Self-pay

## 2018-07-21 MED ORDER — CYANOCOBALAMIN 1000 MCG/ML IJ SOLN: INTRAMUSCULAR | 5 refills | Status: DC

## 2018-07-30 ENCOUNTER — Other Ambulatory Visit: Payer: Self-pay | Admitting: Internal Medicine

## 2018-10-26 IMAGING — MG MM DIGITAL SCREENING BILAT W/ TOMO W/ CAD
9 of 13 series · 9 of 29 positions shown · non-contrast
Comparison: Previous exam(s).

CLINICAL DATA: Screening.

EXAM:
2D DIGITAL SCREENING BILATERAL MAMMOGRAM WITH CAD AND ADJUNCT TOMO

[L CC (1 of 2)]
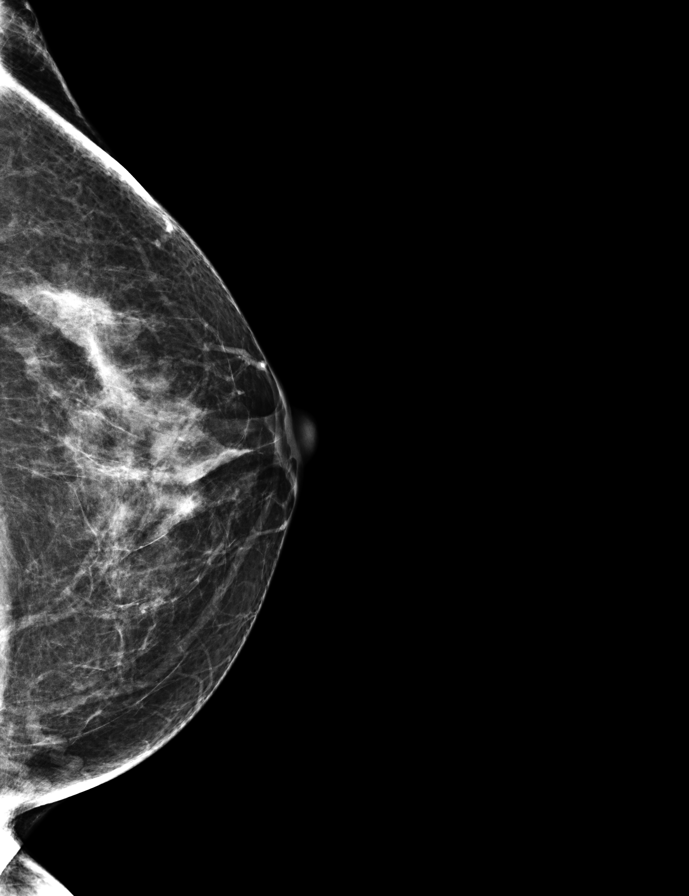

[L CC synth-2D]
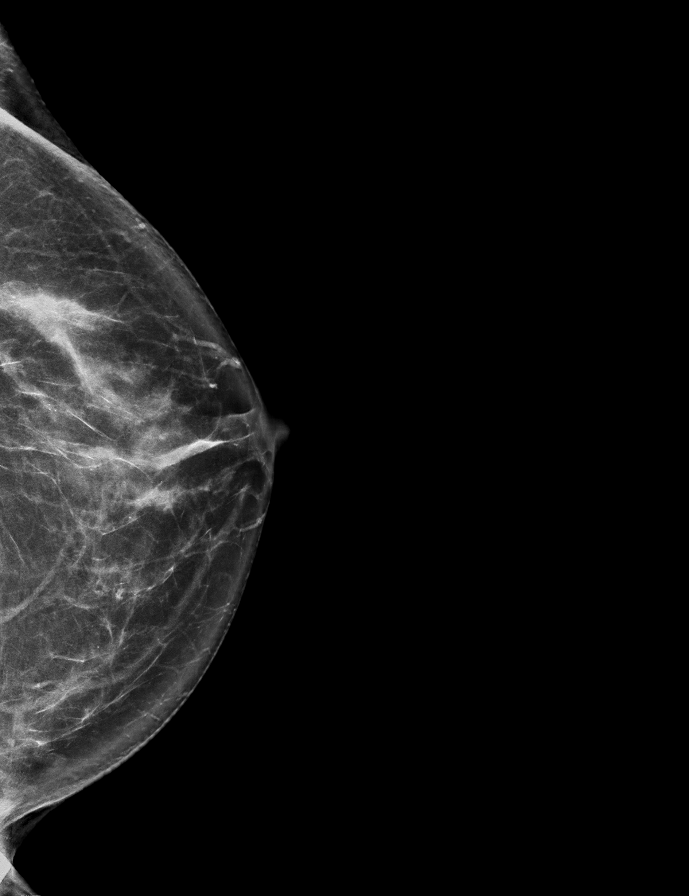

[L MLO synth-2D]
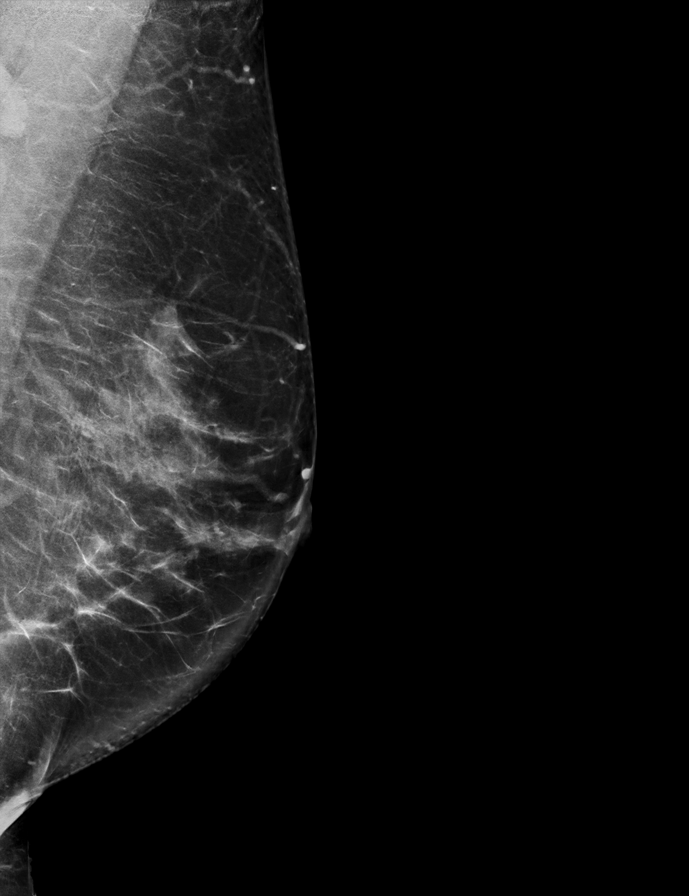

[R MLO]
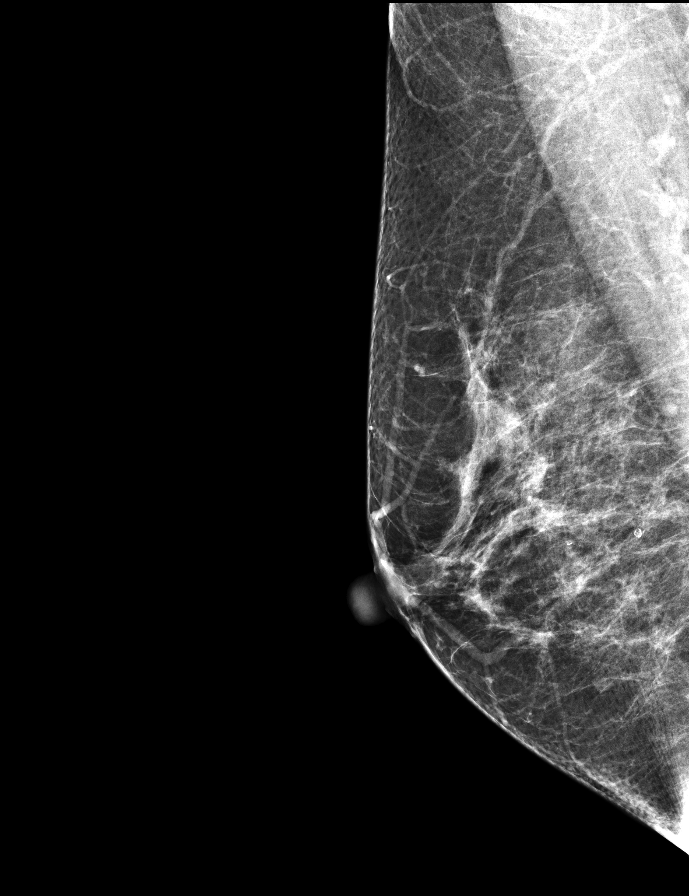

[R CC synth-2D]
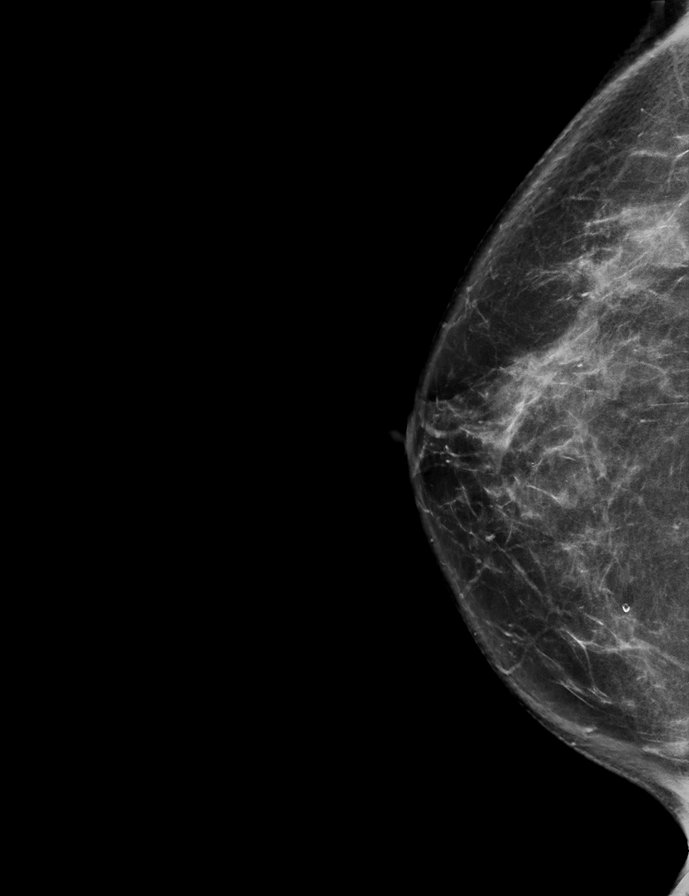

[R CC]
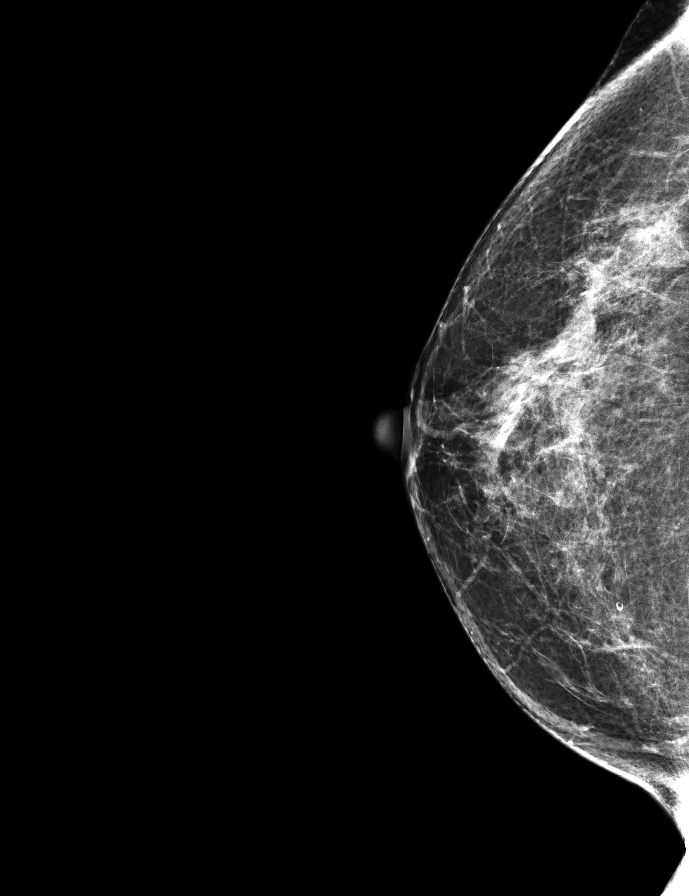

[L CC (2 of 2)]
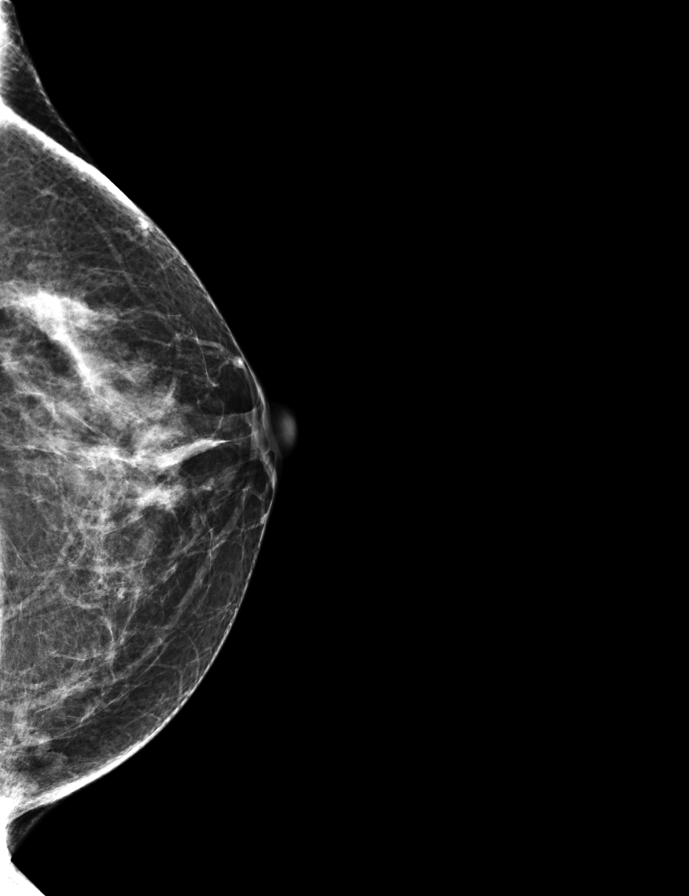

[L MLO]
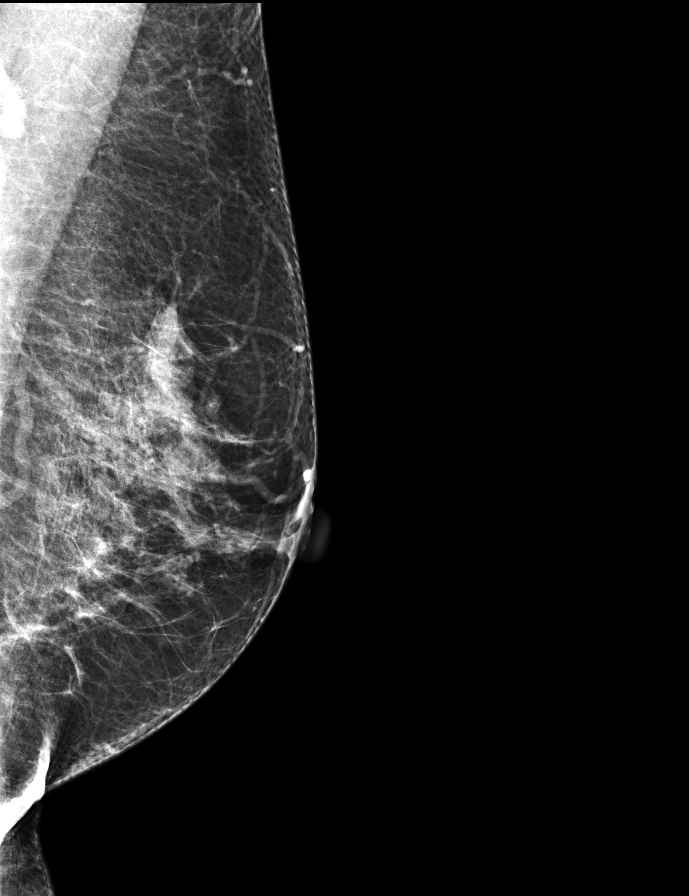

[R MLO synth-2D]
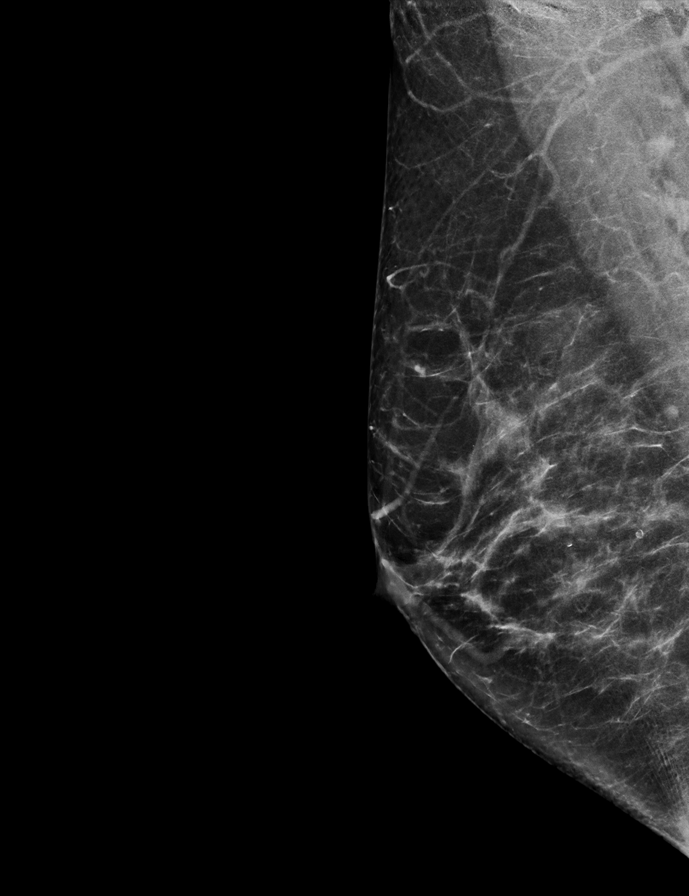

[9 of 29 positions shown; findings below may reference images not displayed]

ACR Breast Density Category c: The breast tissue is heterogeneously
dense, which may obscure small masses.
FINDINGS: There are no findings suspicious for malignancy. Images were
processed with CAD.
IMPRESSION: No mammographic evidence of malignancy. A result letter of this
screening mammogram will be mailed directly to the patient.

RECOMMENDATION:
Screening mammogram in one year. (Code:TN-[id number])

BI-RADS CATEGORY  1: Negative.

## 2018-11-16 ENCOUNTER — Ambulatory Visit: Payer: Commercial Managed Care - PPO | Admitting: Internal Medicine

## 2019-03-17 ENCOUNTER — Ambulatory Visit: Payer: Commercial Managed Care - PPO | Admitting: Internal Medicine

## 2019-06-07 ENCOUNTER — Other Ambulatory Visit: Payer: Self-pay

## 2019-06-08 ENCOUNTER — Encounter (INDEPENDENT_AMBULATORY_CARE_PROVIDER_SITE_OTHER): Payer: Self-pay

## 2019-06-08 ENCOUNTER — Other Ambulatory Visit (HOSPITAL_COMMUNITY)
Admission: RE | Admit: 2019-06-08 | Discharge: 2019-06-08 | Disposition: A | Discharge status: Home / Self Care | Payer: Commercial Managed Care - PPO | Source: Ambulatory Visit | Attending: Internal Medicine | Admitting: Internal Medicine

## 2019-06-08 ENCOUNTER — Ambulatory Visit (INDEPENDENT_AMBULATORY_CARE_PROVIDER_SITE_OTHER): Payer: Commercial Managed Care - PPO | Admitting: Internal Medicine

## 2019-06-08 VITALS — BP 118/72 | HR 82 | Temp 95.3°F | Resp 16 | Ht 67.0 in | Wt 167.6 lb

## 2019-06-08 DIAGNOSIS — E538 Deficiency of other specified B group vitamins: Secondary | ICD-10-CM | POA: Diagnosis not present

## 2019-06-08 DIAGNOSIS — Z124 Encounter for screening for malignant neoplasm of cervix: Secondary | ICD-10-CM | POA: Diagnosis not present

## 2019-06-08 DIAGNOSIS — E7800 Pure hypercholesterolemia, unspecified: Secondary | ICD-10-CM

## 2019-06-08 DIAGNOSIS — K219 Gastro-esophageal reflux disease without esophagitis: Secondary | ICD-10-CM

## 2019-06-08 DIAGNOSIS — Z0000 Encounter for general adult medical examination without abnormal findings: Secondary | ICD-10-CM

## 2019-06-08 DIAGNOSIS — E559 Vitamin D deficiency, unspecified: Secondary | ICD-10-CM

## 2019-06-08 DIAGNOSIS — R5383 Other fatigue: Secondary | ICD-10-CM | POA: Diagnosis not present

## 2019-06-08 DIAGNOSIS — F439 Reaction to severe stress, unspecified: Secondary | ICD-10-CM

## 2019-06-08 DIAGNOSIS — Z720 Tobacco use: Secondary | ICD-10-CM

## 2019-06-08 DIAGNOSIS — Z1239 Encounter for other screening for malignant neoplasm of breast: Secondary | ICD-10-CM

## 2019-06-08 LAB — LIPID PANEL
Cholesterol: 223 mg/dL — ABNORMAL HIGH (ref 0–200)
HDL: 60.7 mg/dL (ref >39.00)
LDL Cholesterol: 130 mg/dL — ABNORMAL HIGH (ref 0–99)
NonHDL: 162.03
Total CHOL/HDL Ratio: 4
Triglycerides: 162 mg/dL — ABNORMAL HIGH (ref 0.0–149.0)
VLDL: 32.4 mg/dL (ref 0.0–40.0)

## 2019-06-08 LAB — VITAMIN D 25 HYDROXY (VIT D DEFICIENCY, FRACTURES): VITD: 32.25 ng/mL (ref 30.00–100.00)

## 2019-06-08 LAB — CBC WITH DIFFERENTIAL/PLATELET
Basophils Absolute: 0 10*3/uL (ref 0.0–0.1)
Basophils Relative: 0.7 % (ref 0.0–3.0)
Eosinophils Absolute: 0.1 10*3/uL (ref 0.0–0.7)
Eosinophils Relative: 1.2 % (ref 0.0–5.0)
HCT: 39.7 % (ref 36.0–46.0)
Hemoglobin: 13.2 g/dL (ref 12.0–15.0)
Lymphocytes Relative: 24.5 % (ref 12.0–46.0)
Lymphs Abs: 1.8 10*3/uL (ref 0.7–4.0)
MCHC: 33.4 g/dL (ref 30.0–36.0)
MCV: 99.1 fl (ref 78.0–100.0)
Monocytes Absolute: 0.6 10*3/uL (ref 0.1–1.0)
Monocytes Relative: 8 % (ref 3.0–12.0)
Neutro Abs: 4.7 10*3/uL (ref 1.4–7.7)
Neutrophils Relative %: 65.6 % (ref 43.0–77.0)
Platelets: 224 10*3/uL (ref 150.0–400.0)
RBC: 4.01 Mil/uL (ref 3.87–5.11)
RDW: 12.7 % (ref 11.5–15.5)
WBC: 7.2 10*3/uL (ref 4.0–10.5)

## 2019-06-08 LAB — COMPREHENSIVE METABOLIC PANEL
ALT: 20 U/L (ref 0–35)
AST: 29 U/L (ref 0–37)
Albumin: 4.4 g/dL (ref 3.5–5.2)
Alkaline Phosphatase: 62 U/L (ref 39–117)
BUN: 13 mg/dL (ref 6–23)
CO2: 28 mEq/L (ref 19–32)
Calcium: 9.5 mg/dL (ref 8.4–10.5)
Chloride: 99 mEq/L (ref 96–112)
Creatinine, Ser: 0.62 mg/dL (ref 0.40–1.20)
GFR: 98.45 mL/min (ref >60.00)
Glucose, Bld: 104 mg/dL — ABNORMAL HIGH (ref 70–99)
Potassium: 4.2 mEq/L (ref 3.5–5.1)
Sodium: 137 mEq/L (ref 135–145)
Total Bilirubin: 0.5 mg/dL (ref 0.2–1.2)
Total Protein: 7.6 g/dL (ref 6.0–8.3)

## 2019-06-08 LAB — TSH: TSH: 0.82 u[IU]/mL (ref 0.35–4.50)

## 2019-06-08 LAB — VITAMIN B12: Vitamin B-12: 176 pg/mL — ABNORMAL LOW (ref 211–911)

## 2019-06-08 NOTE — Progress Notes (Signed)
Patient ID: Peggy Ward, female   DOB: [birth year], 58 y.o.   MRN: [id number]   Subjective:    Patient ID: Peggy Ward, female    DOB: [birth year], 58 y.o.   MRN: [id number]  HPI  Patient here for her physical exam.   Husband passed away in 10-03-18.  Increased stress related to this.  Discussed with her today.  She feels she is handling things relatively well.  Does not feel needs any further intervention.  Has good support.  Trying to stay active.  No chest pain.  No sob.  No acid reflux.  No abdominal pain.  Bowels moving.     Past Medical History:  Diagnosis Date  . Genital warts   . GERD (gastroesophageal reflux disease)   . Hypercholesterolemia   . Vitamin D deficiency    Past Surgical History:  Procedure Laterality Date  . BREAST EXCISIONAL BIOPSY Right 1998   neg   Family History  Problem Relation Age of Onset  . Cancer Father        prostate  . Depression Father   . Heart disease Father        MI, stent  . Mental retardation Father   . Hyperlipidemia Mother   . Cancer Paternal Grandfather        prostate  . Breast cancer Neg Hx    Social History   Socioeconomic History  . Marital status: Married    Spouse name: Not on file  . Number of children: 2  . Years of education: Not on file  . Highest education level: Not on file  Occupational History    Employer: [employer]  Social Needs  . Financial resource strain: Not on file  . Food insecurity    Worry: Not on file    Inability: Not on file  . Transportation needs    Medical: Not on file    Non-medical: Not on file  Tobacco Use  . Smoking status: Current Every Day Smoker    Types: Cigarettes  . Smokeless tobacco: Never Used  Substance and Sexual Activity  . Alcohol use: Yes    Alcohol/week: 0.0 standard drinks    Comment: 3-5 glasses of wine per day  . Drug use: No  . Sexual activity: Not on file  Lifestyle  . Physical activity    Days per week: Not on file    Minutes per session: Not on file  .  Stress: Not on file  Relationships  . Social Musician on phone: Not on file    Gets together: Not on file    Attends religious service: Not on file    Active member of club or organization: Not on file    Attends meetings of clubs or organizations: Not on file    Relationship status: Not on file  Other Topics Concern  . Not on file  Social History Narrative  . Not on file    Outpatient Encounter Medications as of 06/08/2019  Medication Sig  . pantoprazole (PROTONIX) 20 MG tablet TAKE 1 TABLET (20 MG TOTAL) BY MOUTH DAILY.  . [DISCONTINUED] cyanocobalamin (,VITAMIN B-12,) 1000 MCG/ML injection INJECT 3 ML AS DIRECTED EVERY 90 (THIRTY) DAYS.  . [DISCONTINUED] Vitamin D, Ergocalciferol, (DRISDOL) 1.25 MG (50000 UT) CAPS capsule TAKE 1 CAPSULE (50,000 UNITS TOTAL) BY MOUTH EVERY 7 (SEVEN) DAYS.   No facility-administered encounter medications on file as of 06/08/2019.    Review of Systems  Constitutional: Negative for appetite change and  unexpected weight change.  HENT: Negative for congestion and sinus pressure.   Eyes: Negative for pain and visual disturbance.  Respiratory: Negative for cough, chest tightness and shortness of breath.   Cardiovascular: Negative for chest pain, palpitations and leg swelling.  Gastrointestinal: Negative for abdominal pain, diarrhea, nausea and vomiting.  Genitourinary: Negative for difficulty urinating and dysuria.  Musculoskeletal: Negative for joint swelling and myalgias.  Skin: Negative for color change and rash.  Neurological: Negative for dizziness, light-headedness and headaches.  Hematological: Negative for adenopathy. Does not bruise/bleed easily.  Psychiatric/Behavioral: Negative for agitation and dysphoric mood.       Increased stress as outlined.         Objective:    Physical Exam Constitutional:      General: She is not in acute distress.    Appearance: Normal appearance. She is well-developed.  HENT:     Head:  Normocephalic and atraumatic.     Right Ear: External ear normal.     Left Ear: External ear normal.  Eyes:     General: No scleral icterus.       Right eye: No discharge.        Left eye: No discharge.     Conjunctiva/sclera: Conjunctivae normal.  Neck:     Musculoskeletal: Neck supple. No muscular tenderness.     Thyroid: No thyromegaly.  Cardiovascular:     Rate and Rhythm: Normal rate and regular rhythm.  Pulmonary:     Effort: No tachypnea, accessory muscle usage or respiratory distress.     Breath sounds: Normal breath sounds. No decreased breath sounds or wheezing.  Chest:     Breasts:        Right: No inverted nipple, mass, nipple discharge or tenderness (no axillary adenopathy).        Left: No inverted nipple, mass, nipple discharge or tenderness (no axilarry adenopathy).  Abdominal:     General: Bowel sounds are normal.     Palpations: Abdomen is soft.     Tenderness: There is no abdominal tenderness.  Genitourinary:    Comments: Normal external genitalia.  Vaginal vault without lesions.  Cervix identified.  Pap smear performed.  Could not appreciate any adnexal masses or tenderness.   Musculoskeletal:        General: No swelling or tenderness.  Lymphadenopathy:     Cervical: No cervical adenopathy.  Skin:    Findings: No erythema or rash.  Neurological:     Mental Status: She is alert and oriented to person, place, and time.  Psychiatric:        Mood and Affect: Mood normal.        Behavior: Behavior normal.     BP 118/72   Pulse 82   Temp (!) 95.3 F (35.2 C)   Resp 16   Wt 167 lb 9.6 oz (76 kg)   LMP 03/21/2011   SpO2 97%   BMI 26.25 kg/m  Wt Readings from Last 3 Encounters:  06/08/19 167 lb 9.6 oz (76 kg)  05/11/18 168 lb 12.8 oz (76.6 kg)  11/05/17 171 lb 8 oz (77.8 kg)     Lab Results  Component Value Date   WBC 7.2 06/08/2019   HGB 13.2 06/08/2019   HCT 39.7 06/08/2019   PLT 224.0 06/08/2019   GLUCOSE 104 (H) 06/08/2019   CHOL 223 (H)  06/08/2019   TRIG 162.0 (H) 06/08/2019   HDL 60.70 06/08/2019   LDLDIRECT 133.0 05/11/2018   LDLCALC 130 (H) 06/08/2019  ALT 20 06/08/2019   AST 29 06/08/2019   NA 137 06/08/2019   K 4.2 06/08/2019   CL 99 06/08/2019   CREATININE 0.62 06/08/2019   BUN 13 06/08/2019   CO2 28 06/08/2019   TSH 0.82 06/08/2019    Mm 3d Screen Breast Bilateral  Result Date: 07/16/2018 CLINICAL DATA:  Screening. EXAM: DIGITAL SCREENING BILATERAL MAMMOGRAM WITH TOMO AND CAD COMPARISON:  Previous exam(s). ACR Breast Density Category b: There are scattered areas of fibroglandular density. FINDINGS: There are no findings suspicious for malignancy. Images were processed with CAD. IMPRESSION: No mammographic evidence of malignancy. A result letter of this screening mammogram will be mailed directly to the patient. RECOMMENDATION: Screening mammogram in one year. (Code:SM-B-01Y) BI-RADS CATEGORY  1: Negative. Electronically Signed   By: Curlene Dolphin M.D.   On: 07/16/2018 14:34       Assessment & Plan:   Problem List Items Addressed This Visit    B12 deficiency   Relevant Orders   Vitamin B12 (Completed)   Fatigue   Relevant Orders   CBC with Differential/Platelet (Completed)   Comprehensive metabolic panel (Completed)   TSH (Completed)   GERD (gastroesophageal reflux disease)    Controlled on current regimen.  Follow.        Health care maintenance    Physical today 06/08/19.  PAP 06/08/19.  Mammogram 11.1.19 - birads I.  Schedule f/u mammogram.  Colonoscopy 12/2017.  Recommended f/u colonoscopy in 5 years.        Hypercholesterolemia    Low cholesterol diet and exercise.  Follow lipid panel.        Relevant Orders   Lipid panel (Completed)   Stress    Increased stress as outlined.  Discussed with her today.  She has good support.  Does not feel needs any further intervention.  Follow.        Tobacco abuse    Have discussed the need to quit smoking.  Follow.        Vitamin D deficiency     Follow vitamin D level.        Relevant Orders   VITAMIN D 25 Hydroxy (Vit-D Deficiency, Fractures) (Completed)    Other Visit Diagnoses    Routine general medical examination at a health care facility    -  Primary   Breast cancer screening       Relevant Orders   MM 3D SCREEN BREAST BILATERAL   Cervical cancer screening       Relevant Orders   Cytology - PAP( [place]) (Completed)       Einar Pheasant, MD

## 2019-06-10 LAB — CYTOLOGY - PAP
Diagnosis: NEGATIVE
High risk HPV: NEGATIVE
Molecular Disclaimer: 56
Molecular Disclaimer: NORMAL

## 2019-06-12 ENCOUNTER — Encounter: Payer: Self-pay | Admitting: Internal Medicine

## 2019-06-12 NOTE — Assessment & Plan Note (Signed)
Increased stress as outlined.  Discussed with her today.  She has good support.  Does not feel needs any further intervention.  Follow.   

## 2019-06-12 NOTE — Assessment & Plan Note (Signed)
Controlled on current regimen.  Follow.  

## 2019-06-12 NOTE — Assessment & Plan Note (Signed)
Physical today 06/08/19.  PAP 06/08/19.  Mammogram 11.1.19 - birads I.  Schedule f/u mammogram.  Colonoscopy 12/2017.  Recommended f/u colonoscopy in 5 years.

## 2019-06-12 NOTE — Assessment & Plan Note (Signed)
Low cholesterol diet and exercise.  Follow lipid panel.   

## 2019-06-12 NOTE — Assessment & Plan Note (Signed)
Have discussed the need to quit smoking.  Follow.  

## 2019-06-12 NOTE — Assessment & Plan Note (Signed)
Follow vitamin D level.  

## 2019-06-13 ENCOUNTER — Encounter: Payer: Self-pay | Admitting: Internal Medicine

## 2019-06-13 NOTE — Telephone Encounter (Signed)
See result note. Pt asking about her labs.

## 2019-06-14 ENCOUNTER — Encounter: Payer: Self-pay | Admitting: Internal Medicine

## 2019-06-14 MED ORDER — PHENYLEPHRINE HCL-NACL 10-0.9 MG/250ML-% IV SOLN
INTRAVENOUS | Status: AC
  Filled 2019-06-14: qty 250

## 2019-06-14 NOTE — Telephone Encounter (Signed)
Pt returned call pls call back [phone]

## 2019-06-15 ENCOUNTER — Other Ambulatory Visit: Payer: Self-pay

## 2019-06-15 MED ORDER — CYANOCOBALAMIN 1000 MCG/ML IJ SOLN: INTRAMUSCULAR | 0 refills | Status: DC

## 2019-06-17 ENCOUNTER — Encounter: Payer: Self-pay | Admitting: Internal Medicine

## 2019-08-02 ENCOUNTER — Ambulatory Visit
Admission: RE | Admit: 2019-08-02 | Discharge: 2019-08-02 | Disposition: A | Discharge status: Home / Self Care | Payer: Commercial Managed Care - PPO | Source: Ambulatory Visit | Attending: Internal Medicine | Admitting: Internal Medicine

## 2019-08-02 DIAGNOSIS — Z1231 Encounter for screening mammogram for malignant neoplasm of breast: Secondary | ICD-10-CM | POA: Insufficient documentation

## 2019-08-02 DIAGNOSIS — Z1239 Encounter for other screening for malignant neoplasm of breast: Secondary | ICD-10-CM

## 2019-11-18 ENCOUNTER — Telehealth: Payer: Self-pay | Admitting: Internal Medicine

## 2019-11-18 MED ORDER — PANTOPRAZOLE SODIUM 20 MG PO TBEC: DELAYED_RELEASE_TABLET | ORAL | 1 refills | Status: DC

## 2019-11-18 NOTE — Telephone Encounter (Signed)
ok 

## 2019-11-18 NOTE — Telephone Encounter (Signed)
Okay to initiate PA

## 2019-11-18 NOTE — Telephone Encounter (Signed)
Pt needs auth on pantoprazole (PROTONIX) 20 MG tablet. Pt also wants pharmacy changed to [facility] at [address].

## 2019-11-18 NOTE — Telephone Encounter (Signed)
Spoke with patient's pharmacy and they state they are needing a refill before starting a PA process as the patient is out of scripts.  Medication sent in to preferred pharmacy per protocol.

## 2019-12-07 ENCOUNTER — Encounter: Payer: Self-pay | Admitting: Internal Medicine

## 2019-12-07 ENCOUNTER — Telehealth: Payer: Self-pay | Admitting: Internal Medicine

## 2019-12-07 ENCOUNTER — Telehealth (INDEPENDENT_AMBULATORY_CARE_PROVIDER_SITE_OTHER): Payer: Commercial Managed Care - PPO | Admitting: Internal Medicine

## 2019-12-07 VITALS — Ht 67.0 in | Wt 168.0 lb

## 2019-12-07 DIAGNOSIS — K219 Gastro-esophageal reflux disease without esophagitis: Secondary | ICD-10-CM | POA: Diagnosis not present

## 2019-12-07 DIAGNOSIS — Z13220 Encounter for screening for lipoid disorders: Secondary | ICD-10-CM | POA: Diagnosis not present

## 2019-12-07 DIAGNOSIS — E7800 Pure hypercholesterolemia, unspecified: Secondary | ICD-10-CM | POA: Diagnosis not present

## 2019-12-07 DIAGNOSIS — F439 Reaction to severe stress, unspecified: Secondary | ICD-10-CM

## 2019-12-07 DIAGNOSIS — M2550 Pain in unspecified joint: Secondary | ICD-10-CM

## 2019-12-07 NOTE — Progress Notes (Signed)
Patient ID: Peggy Ward, female   DOB: [birth year], 59 y.o.   MRN: [id number]   Virtual Visit via video Note  This visit type was conducted due to national recommendations for restrictions regarding the COVID-19 pandemic (e.g. social distancing).  This format is felt to be most appropriate for this patient at this time.  All issues noted in this document were discussed and addressed.  No physical exam was performed (except for noted visual exam findings with Video Visits).   I connected with Peggy Ward by a video enabled telemedicine application and verified that I am speaking with the correct person using two identifiers. Location patient: home Location provider: work  Persons participating in the virtual visit: patient, provider  The limitations, risks, security and privacy concerns of performing an evaluation and management service by video and the availability of in person appointments have been discussed.  The patient expressed understanding and agreed to proceed.   Reason for visit: scheduled follow up.    HPI: She reports she is doing relatively well.  Still dealing with increased stress.  Discussed with her today.  Discussed counseling.  Will send names of counselors.  Tries to stay active.  No chest pain or sob reported.  No abdominal pain or bowel change reported.  Arms -sore.  Present for 6 months.  Worse with lifting.  Pain in hips - sitting. Walking better.  No cough or congestion reported.     ROS: See pertinent positives and negatives per HPI.  Past Medical History:  Diagnosis Date  . Genital warts   . GERD (gastroesophageal reflux disease)   . Hypercholesterolemia   . Vitamin D deficiency     Past Surgical History:  Procedure Laterality Date  . BREAST EXCISIONAL BIOPSY Right 1998   neg    Family History  Problem Relation Age of Onset  . Cancer Father        prostate  . Depression Father   . Heart disease Father        MI, stent  . Mental retardation  Father   . Hyperlipidemia Mother   . Cancer Paternal Grandfather        prostate  . Breast cancer Neg Hx     SOCIAL HX: reviewed.    Current Outpatient Medications:  .  cyanocobalamin (,VITAMIN B-12,) 1000 MCG/ML injection, Inject 1 ml into the skin once a week for 4 weeks and then once every 30 days., Disp: 10 mL, Rfl: 0 .  pantoprazole (PROTONIX) 20 MG tablet, TAKE 1 TABLET (20 MG TOTAL) BY MOUTH DAILY., Disp: 30 tablet, Rfl: 1  EXAM:  GENERAL: alert, oriented, appears well and in no acute distress  HEENT: atraumatic, conjunttiva clear, no obvious abnormalities on inspection of external nose and ears  NECK: normal movements of the head and neck  LUNGS: on inspection no signs of respiratory distress, breathing rate appears normal, no obvious gross SOB, gasping or wheezing  CV: no obvious cyanosis  PSYCH/NEURO: pleasant and cooperative, no obvious depression or anxiety, speech and thought processing grossly intact  ASSESSMENT AND PLAN:  Discussed the following assessment and plan:  GERD (gastroesophageal reflux disease) No upper symptoms reported.  Follow.    Hypercholesterolemia Low cholesterol diet and exercise.  Follow lipid panel.   Stress Increased stress as outlined.  Discussed with her today.  Will send names of counselors.  Follow.  Notify me if feels needs any further intervention.    Joint pain Arms and hip pain as outlined.  Stretches.  Exercise.  Check esr.  Follow.    Orders Placed This Encounter  Procedures  . CBC with Differential/Platelet    Standing Status:   Future    Standing Expiration Date:   12/10/2020  . Lipid panel    Standing Status:   Future    Standing Expiration Date:   12/10/2020  . Sedimentation rate    Standing Status:   Future    Standing Expiration Date:   12/10/2020    I discussed the assessment and treatment plan with the patient. The patient was provided an opportunity to ask questions and all were answered. The patient agreed  with the plan and demonstrated an understanding of the instructions.   The patient was advised to call back or seek an in-person evaluation if the symptoms worsen or if the condition fails to improve as anticipated.    Einar Pheasant, MD

## 2019-12-07 NOTE — Telephone Encounter (Signed)
Unable to lm to schedule fasting labs in the next 2 weeks and a CPE in 55m

## 2019-12-11 ENCOUNTER — Encounter: Payer: Self-pay | Admitting: Internal Medicine

## 2019-12-11 DIAGNOSIS — M2550 Pain in unspecified joint: Secondary | ICD-10-CM | POA: Insufficient documentation

## 2019-12-11 NOTE — Assessment & Plan Note (Signed)
No upper symptoms reported.  Follow.   

## 2019-12-11 NOTE — Assessment & Plan Note (Signed)
Low cholesterol diet and exercise.  Follow lipid panel.   

## 2019-12-11 NOTE — Assessment & Plan Note (Signed)
Arms and hip pain as outlined.  Stretches.  Exercise.  Check esr.  Follow.

## 2019-12-11 NOTE — Assessment & Plan Note (Signed)
Increased stress as outlined.  Discussed with her today.  Will send names of counselors.  Follow.  Notify me if feels needs any further intervention.

## 2019-12-22 ENCOUNTER — Other Ambulatory Visit: Payer: Self-pay

## 2019-12-22 ENCOUNTER — Other Ambulatory Visit (INDEPENDENT_AMBULATORY_CARE_PROVIDER_SITE_OTHER): Payer: Commercial Managed Care - PPO

## 2019-12-22 DIAGNOSIS — E7800 Pure hypercholesterolemia, unspecified: Secondary | ICD-10-CM

## 2019-12-22 DIAGNOSIS — M2550 Pain in unspecified joint: Secondary | ICD-10-CM

## 2019-12-22 LAB — LIPID PANEL
Cholesterol: 218 mg/dL — ABNORMAL HIGH (ref 0–200)
HDL: 60.1 mg/dL (ref >39.00)
NonHDL: 157.57
Total CHOL/HDL Ratio: 4
Triglycerides: 248 mg/dL — ABNORMAL HIGH (ref 0.0–149.0)
VLDL: 49.6 mg/dL — ABNORMAL HIGH (ref 0.0–40.0)

## 2019-12-22 LAB — CBC WITH DIFFERENTIAL/PLATELET
Basophils Absolute: 0 10*3/uL (ref 0.0–0.1)
Basophils Relative: 0.6 % (ref 0.0–3.0)
Eosinophils Absolute: 0.1 10*3/uL (ref 0.0–0.7)
Eosinophils Relative: 2.1 % (ref 0.0–5.0)
HCT: 38.1 % (ref 36.0–46.0)
Hemoglobin: 12.7 g/dL (ref 12.0–15.0)
Lymphocytes Relative: 41 % (ref 12.0–46.0)
Lymphs Abs: 2.3 10*3/uL (ref 0.7–4.0)
MCHC: 33.3 g/dL (ref 30.0–36.0)
MCV: 97.8 fl (ref 78.0–100.0)
Monocytes Absolute: 0.5 10*3/uL (ref 0.1–1.0)
Monocytes Relative: 8.8 % (ref 3.0–12.0)
Neutro Abs: 2.7 10*3/uL (ref 1.4–7.7)
Neutrophils Relative %: 47.5 % (ref 43.0–77.0)
Platelets: 229 10*3/uL (ref 150.0–400.0)
RBC: 3.89 Mil/uL (ref 3.87–5.11)
RDW: 12.9 % (ref 11.5–15.5)
WBC: 5.7 10*3/uL (ref 4.0–10.5)

## 2019-12-22 LAB — SEDIMENTATION RATE: Sed Rate: 25 mm/hr (ref 0–30)

## 2019-12-22 LAB — LDL CHOLESTEROL, DIRECT: Direct LDL: 124 mg/dL

## 2019-12-26 ENCOUNTER — Encounter: Payer: Self-pay | Admitting: Internal Medicine

## 2020-01-02 ENCOUNTER — Encounter: Payer: Self-pay | Admitting: Internal Medicine

## 2020-01-02 NOTE — Telephone Encounter (Signed)
Please send a list of counselors to her and notify her that we are sending.  Also, let her know to let me know if needs anything.

## 2020-06-05 ENCOUNTER — Encounter: Payer: Self-pay | Admitting: Internal Medicine

## 2020-06-05 ENCOUNTER — Ambulatory Visit (INDEPENDENT_AMBULATORY_CARE_PROVIDER_SITE_OTHER): Payer: Commercial Managed Care - PPO | Admitting: Internal Medicine

## 2020-06-05 ENCOUNTER — Other Ambulatory Visit: Payer: Self-pay

## 2020-06-05 VITALS — BP 126/78 | HR 80 | Temp 98.2°F | Resp 16 | Ht 67.0 in | Wt 170.0 lb

## 2020-06-05 DIAGNOSIS — E538 Deficiency of other specified B group vitamins: Secondary | ICD-10-CM

## 2020-06-05 DIAGNOSIS — E7800 Pure hypercholesterolemia, unspecified: Secondary | ICD-10-CM

## 2020-06-05 DIAGNOSIS — E559 Vitamin D deficiency, unspecified: Secondary | ICD-10-CM | POA: Diagnosis not present

## 2020-06-05 DIAGNOSIS — Z1231 Encounter for screening mammogram for malignant neoplasm of breast: Secondary | ICD-10-CM

## 2020-06-05 DIAGNOSIS — Z0000 Encounter for general adult medical examination without abnormal findings: Secondary | ICD-10-CM

## 2020-06-05 DIAGNOSIS — F439 Reaction to severe stress, unspecified: Secondary | ICD-10-CM

## 2020-06-05 LAB — VITAMIN D 25 HYDROXY (VIT D DEFICIENCY, FRACTURES): VITD: 22.33 ng/mL — ABNORMAL LOW (ref 30.00–100.00)

## 2020-06-05 LAB — COMPREHENSIVE METABOLIC PANEL
ALT: 28 U/L (ref 0–35)
AST: 39 U/L — ABNORMAL HIGH (ref 0–37)
Albumin: 4.4 g/dL (ref 3.5–5.2)
Alkaline Phosphatase: 66 U/L (ref 39–117)
BUN: 16 mg/dL (ref 6–23)
CO2: 24 mEq/L (ref 19–32)
Calcium: 8.9 mg/dL (ref 8.4–10.5)
Chloride: 105 mEq/L (ref 96–112)
Creatinine, Ser: 0.61 mg/dL (ref 0.40–1.20)
GFR: 99.98 mL/min (ref >60.00)
Glucose, Bld: 84 mg/dL (ref 70–99)
Potassium: 4.1 mEq/L (ref 3.5–5.1)
Sodium: 139 mEq/L (ref 135–145)
Total Bilirubin: 0.3 mg/dL (ref 0.2–1.2)
Total Protein: 7.3 g/dL (ref 6.0–8.3)

## 2020-06-05 LAB — LIPID PANEL
Cholesterol: 229 mg/dL — ABNORMAL HIGH (ref 0–200)
HDL: 62.3 mg/dL (ref >39.00)
LDL Cholesterol: 130 mg/dL — ABNORMAL HIGH (ref 0–99)
NonHDL: 166.94
Total CHOL/HDL Ratio: 4
Triglycerides: 187 mg/dL — ABNORMAL HIGH (ref 0.0–149.0)
VLDL: 37.4 mg/dL (ref 0.0–40.0)

## 2020-06-05 LAB — VITAMIN B12: Vitamin B-12: 178 pg/mL — ABNORMAL LOW (ref 211–911)

## 2020-06-05 LAB — TSH: TSH: 1.44 u[IU]/mL (ref 0.35–4.50)

## 2020-06-05 NOTE — Assessment & Plan Note (Addendum)
Physical today 06/05/20.  PAP 06/08/19 - negative with negative HPV.  Mammogram 08/03/19 - Birads I.  Colonoscopy 12/2017 - recommended f/u colonoscopy in 5 years.  Schedule f/u mammogram.

## 2020-06-05 NOTE — Progress Notes (Signed)
Patient ID: Peggy Ward, female   DOB: October 21, 1960, 59 y.o.   MRN: [id number] .   Subjective:    Patient ID: Peggy Ward, female    DOB: April 18, 1961, 59 y.o.   MRN: [id number]  HPI This visit occurred during the SARS-CoV-2 public health emergency.  Safety protocols were in place, including screening questions prior to the visit, additional usage of staff PPE, and extensive cleaning of exam room while observing appropriate contact time as indicated for disinfecting solutions.  Patient here for her physical exam.  States she is doing relatively well.  Some increased stress.  Overall handling things relatively well.  Does not do a lot of formal exercise, but does stay physically active.  No chest pain or sob.  No acid reflux or abdominal pain reported.  No bowel change reported.  Planning to get her flu vaccine at work.    Past Medical History:  Diagnosis Date  . Genital warts   . GERD (gastroesophageal reflux disease)   . Hypercholesterolemia   . Vitamin D deficiency    Past Surgical History:  Procedure Laterality Date  . BREAST EXCISIONAL BIOPSY Right 1998   neg   Family History  Problem Relation Age of Onset  . Cancer Father        prostate  . Depression Father   . Heart disease Father        MI, stent  . Mental retardation Father   . Hyperlipidemia Mother   . Cancer Paternal Grandfather        prostate  . Breast cancer Neg Hx    Social History   Socioeconomic History  . Marital status: Married    Spouse name: Not on file  . Number of children: 2  . Years of education: Not on file  . Highest education level: Not on file  Occupational History    Employer: [employer]  Tobacco Use  . Smoking status: Current Every Day Smoker    Types: Cigarettes  . Smokeless tobacco: Never Used  Substance and Sexual Activity  . Alcohol use: Yes    Alcohol/week: 0.0 standard drinks    Comment: 3-5 glasses of wine per day  . Drug use: No  . Sexual activity: Not on file  Other Topics  Concern  . Not on file  Social History Narrative  . Not on file   Social Determinants of Health   Financial Resource Strain:   . Difficulty of Paying Living Expenses: Not on file  Food Insecurity:   . Worried About Programme researcher, broadcasting/film/video in the Last Year: Not on file  . Ran Out of Food in the Last Year: Not on file  Transportation Needs:   . Lack of Transportation (Medical): Not on file  . Lack of Transportation (Non-Medical): Not on file  Physical Activity:   . Days of Exercise per Week: Not on file  . Minutes of Exercise per Session: Not on file  Stress:   . Feeling of Stress : Not on file  Social Connections:   . Frequency of Communication with Friends and Family: Not on file  . Frequency of Social Gatherings with Friends and Family: Not on file  . Attends Religious Services: Not on file  . Active Member of Clubs or Organizations: Not on file  . Attends Banker Meetings: Not on file  . Marital Status: Not on file    Outpatient Encounter Medications as of 06/05/2020  Medication Sig  . pantoprazole (PROTONIX) 20 MG tablet  TAKE 1 TABLET (20 MG TOTAL) BY MOUTH DAILY.  . [DISCONTINUED] cyanocobalamin (,VITAMIN B-12,) 1000 MCG/ML injection Inject 1 ml into the skin once a week for 4 weeks and then once every 30 days.   No facility-administered encounter medications on file as of 06/05/2020.   Review of Systems  Constitutional: Negative for appetite change and unexpected weight change.  HENT: Negative for congestion and sinus pressure.   Eyes: Negative for pain and visual disturbance.  Respiratory: Negative for cough, chest tightness and shortness of breath.   Cardiovascular: Negative for chest pain, palpitations and leg swelling.  Gastrointestinal: Negative for abdominal pain, diarrhea, nausea and vomiting.  Genitourinary: Negative for difficulty urinating and dysuria.  Musculoskeletal: Negative for joint swelling and myalgias.  Skin: Negative for color change and  rash.  Neurological: Negative for dizziness, light-headedness and headaches.  Hematological: Negative for adenopathy. Does not bruise/bleed easily.  Psychiatric/Behavioral: Negative for agitation and dysphoric mood.       Objective:    Physical Exam Vitals reviewed.  Constitutional:      General: She is not in acute distress.    Appearance: Normal appearance. She is well-developed.  HENT:     Head: Normocephalic and atraumatic.     Right Ear: External ear normal.     Left Ear: External ear normal.  Eyes:     General: No scleral icterus.       Right eye: No discharge.        Left eye: No discharge.     Conjunctiva/sclera: Conjunctivae normal.  Neck:     Thyroid: No thyromegaly.  Cardiovascular:     Rate and Rhythm: Normal rate and regular rhythm.  Pulmonary:     Effort: No tachypnea, accessory muscle usage or respiratory distress.     Breath sounds: Normal breath sounds. No decreased breath sounds or wheezing.  Chest:     Breasts:        Right: No inverted nipple, mass, nipple discharge or tenderness (no axillary adenopathy).        Left: No inverted nipple, mass, nipple discharge or tenderness (no axilarry adenopathy).  Abdominal:     General: Bowel sounds are normal.     Palpations: Abdomen is soft.     Tenderness: There is no abdominal tenderness.  Musculoskeletal:        General: No swelling or tenderness.     Cervical back: Neck supple. No tenderness.  Lymphadenopathy:     Cervical: No cervical adenopathy.  Skin:    Findings: No erythema or rash.  Neurological:     Mental Status: She is alert and oriented to person, place, and time.  Psychiatric:        Mood and Affect: Mood normal.        Behavior: Behavior normal.     BP 126/78   Pulse 80   Temp 98.2 F (36.8 C) (Oral)   Resp 16   Ht 5\' 7"  (1.702 m)   Wt 170 lb (77.1 kg)   LMP 03/21/2011   SpO2 97%   BMI 26.63 kg/m  Wt Readings from Last 3 Encounters:  06/05/20 170 lb (77.1 kg)  12/07/19 168 lb  (76.2 kg)  06/08/19 167 lb 9.6 oz (76 kg)     Lab Results  Component Value Date   WBC 5.7 12/22/2019   HGB 12.7 12/22/2019   HCT 38.1 12/22/2019   PLT 229.0 12/22/2019   GLUCOSE 84 06/05/2020   CHOL 229 (H) 06/05/2020   TRIG 187.0 (H)  06/05/2020   HDL 62.30 06/05/2020   LDLDIRECT 124.0 12/22/2019   LDLCALC 130 (H) 06/05/2020   ALT 28 06/05/2020   AST 39 (H) 06/05/2020   NA 139 06/05/2020   K 4.1 06/05/2020   CL 105 06/05/2020   CREATININE 0.61 06/05/2020   BUN 16 06/05/2020   CO2 24 06/05/2020   TSH 1.44 06/05/2020    MM 3D SCREEN BREAST BILATERAL  Result Date: 08/03/2019 CLINICAL DATA:  Screening. EXAM: DIGITAL SCREENING BILATERAL MAMMOGRAM WITH TOMO AND CAD COMPARISON:  Previous exam(s). ACR Breast Density Category c: The breast tissue is heterogeneously dense, which may obscure small masses. FINDINGS: There are no findings suspicious for malignancy. Images were processed with CAD. IMPRESSION: No mammographic evidence of malignancy. A result letter of this screening mammogram will be mailed directly to the patient. RECOMMENDATION: Screening mammogram in one year. (Code:SM-B-01Y) BI-RADS CATEGORY  1: Negative. Electronically Signed   By: Emmaline Kluver M.D.   On: 08/03/2019 09:01       Assessment & Plan:   Problem List Items Addressed This Visit    Vitamin D deficiency    Follow vitamin D level.       Relevant Orders   VITAMIN D 25 Hydroxy (Vit-D Deficiency, Fractures) (Completed)   Stress    Increased stress.  Overall she feels she is handling things relatively well.  Follow.        Hypercholesterolemia    Low cholesterol diet and exercise.  Follow lipid panel.       Relevant Orders   Comprehensive metabolic panel (Completed)   Lipid panel (Completed)   TSH (Completed)   Health care maintenance    Physical today 06/05/20.  PAP 06/08/19 - negative with negative HPV.  Mammogram 08/03/19 - Birads I.  Colonoscopy 12/2017 - recommended f/u colonoscopy in 5 years.   Schedule f/u mammogram.        B12 deficiency    Check B12 level.       Relevant Orders   Vitamin B12 (Completed)    Other Visit Diagnoses    Encounter for screening mammogram for malignant neoplasm of breast    -  Primary   Relevant Orders   MM 3D SCREEN BREAST BILATERAL       Dale [place], MD

## 2020-06-06 ENCOUNTER — Other Ambulatory Visit: Payer: Self-pay

## 2020-06-06 ENCOUNTER — Telehealth: Payer: Self-pay | Admitting: Internal Medicine

## 2020-06-06 ENCOUNTER — Other Ambulatory Visit: Payer: Self-pay | Admitting: Internal Medicine

## 2020-06-06 DIAGNOSIS — R7989 Other specified abnormal findings of blood chemistry: Secondary | ICD-10-CM

## 2020-06-06 MED ORDER — CYANOCOBALAMIN 1000 MCG/ML IJ SOLN: INTRAMUSCULAR | 0 refills | Status: DC

## 2020-06-06 NOTE — Progress Notes (Signed)
Order placed for f/u lab.   

## 2020-06-06 NOTE — Telephone Encounter (Signed)
Patient has not had since 2019 please advise.

## 2020-06-06 NOTE — Telephone Encounter (Signed)
Pt needs a refill on famciclovir (FAMVIR) 500 MG tablet

## 2020-06-07 MED ORDER — FAMCICLOVIR 500 MG PO TABS: 500.0000 mg | ORAL_TABLET | Freq: Two times a day (BID) | ORAL | 0 refills | Status: AC | PRN

## 2020-06-07 NOTE — Telephone Encounter (Signed)
rx sent in for famvir.  Has history of fever blisters.

## 2020-06-10 ENCOUNTER — Encounter: Payer: Self-pay | Admitting: Internal Medicine

## 2020-06-10 NOTE — Assessment & Plan Note (Signed)
Check B12 level. 

## 2020-06-10 NOTE — Assessment & Plan Note (Signed)
Follow vitamin D level.  

## 2020-06-10 NOTE — Assessment & Plan Note (Signed)
Increased stress.  Overall she feels she is handling things relatively well.  Follow.   

## 2020-06-10 NOTE — Assessment & Plan Note (Signed)
Low cholesterol diet and exercise.  Follow lipid panel.   

## 2020-06-11 ENCOUNTER — Encounter: Payer: Self-pay | Admitting: Internal Medicine

## 2020-06-29 ENCOUNTER — Other Ambulatory Visit (INDEPENDENT_AMBULATORY_CARE_PROVIDER_SITE_OTHER): Payer: Commercial Managed Care - PPO

## 2020-06-29 ENCOUNTER — Other Ambulatory Visit: Payer: Self-pay

## 2020-06-29 DIAGNOSIS — R7989 Other specified abnormal findings of blood chemistry: Secondary | ICD-10-CM

## 2020-06-29 DIAGNOSIS — R945 Abnormal results of liver function studies: Secondary | ICD-10-CM | POA: Diagnosis not present

## 2020-06-29 NOTE — Addendum Note (Signed)
Addended by: Bonnell Public I on: 06/29/2020 02:34 PM   Modules accepted: Orders

## 2020-06-30 ENCOUNTER — Other Ambulatory Visit: Payer: Self-pay | Admitting: Internal Medicine

## 2020-06-30 DIAGNOSIS — R945 Abnormal results of liver function studies: Secondary | ICD-10-CM

## 2020-06-30 DIAGNOSIS — R7989 Other specified abnormal findings of blood chemistry: Secondary | ICD-10-CM

## 2020-06-30 LAB — HEPATIC FUNCTION PANEL
AG Ratio: 1.4 (calc) (ref 1.0–2.5)
ALT: 30 U/L — ABNORMAL HIGH (ref 6–29)
AST: 35 U/L (ref 10–35)
Albumin: 4.5 g/dL (ref 3.6–5.1)
Alkaline phosphatase (APISO): 61 U/L (ref 37–153)
Bilirubin, Direct: 0.1 mg/dL (ref 0.0–0.2)
Globulin: 3.3 g/dL (calc) (ref 1.9–3.7)
Indirect Bilirubin: 0.2 mg/dL (calc) (ref 0.2–1.2)
Total Bilirubin: 0.3 mg/dL (ref 0.2–1.2)
Total Protein: 7.8 g/dL (ref 6.1–8.1)

## 2020-06-30 NOTE — Progress Notes (Signed)
Order placed for f/u liver panel.  

## 2020-07-05 ENCOUNTER — Encounter: Payer: Self-pay | Admitting: Internal Medicine

## 2020-08-13 ENCOUNTER — Other Ambulatory Visit: Payer: Self-pay

## 2020-08-13 ENCOUNTER — Ambulatory Visit
Admission: RE | Admit: 2020-08-13 | Discharge: 2020-08-13 | Disposition: A | Discharge status: Home / Self Care | Payer: Commercial Managed Care - PPO | Source: Ambulatory Visit | Attending: Internal Medicine | Admitting: Internal Medicine

## 2020-08-13 DIAGNOSIS — Z1231 Encounter for screening mammogram for malignant neoplasm of breast: Secondary | ICD-10-CM | POA: Insufficient documentation

## 2020-08-26 ENCOUNTER — Encounter: Payer: Self-pay | Admitting: Internal Medicine

## 2020-08-28 NOTE — Telephone Encounter (Signed)
Appt already has been rescheduled for 09/13/20

## 2020-08-29 ENCOUNTER — Other Ambulatory Visit: Payer: Commercial Managed Care - PPO

## 2020-09-13 ENCOUNTER — Other Ambulatory Visit: Payer: Self-pay

## 2020-09-13 ENCOUNTER — Other Ambulatory Visit (INDEPENDENT_AMBULATORY_CARE_PROVIDER_SITE_OTHER): Payer: Commercial Managed Care - PPO

## 2020-09-13 DIAGNOSIS — R7989 Other specified abnormal findings of blood chemistry: Secondary | ICD-10-CM

## 2020-09-13 DIAGNOSIS — R945 Abnormal results of liver function studies: Secondary | ICD-10-CM

## 2020-09-13 LAB — HEPATIC FUNCTION PANEL
ALT: 17 U/L (ref 0–35)
AST: 20 U/L (ref 0–37)
Albumin: 4.4 g/dL (ref 3.5–5.2)
Alkaline Phosphatase: 61 U/L (ref 39–117)
Bilirubin, Direct: 0.1 mg/dL (ref 0.0–0.3)
Total Bilirubin: 0.4 mg/dL (ref 0.2–1.2)
Total Protein: 7 g/dL (ref 6.0–8.3)

## 2020-12-04 ENCOUNTER — Ambulatory Visit: Payer: Commercial Managed Care - PPO | Admitting: Internal Medicine

## 2021-01-04 ENCOUNTER — Ambulatory Visit: Payer: Commercial Managed Care - PPO | Admitting: Internal Medicine

## 2021-02-12 ENCOUNTER — Ambulatory Visit: Payer: Commercial Managed Care - PPO | Admitting: Internal Medicine

## 2021-02-12 ENCOUNTER — Other Ambulatory Visit: Payer: Self-pay

## 2021-02-12 DIAGNOSIS — F439 Reaction to severe stress, unspecified: Secondary | ICD-10-CM

## 2021-02-12 DIAGNOSIS — E7800 Pure hypercholesterolemia, unspecified: Secondary | ICD-10-CM | POA: Diagnosis not present

## 2021-02-12 NOTE — Progress Notes (Signed)
Patient ID: Peggy Ward, female   DOB: [birth year], 60 y.o.   MRN: [id number]   Subjective:    Patient ID: Peggy Ward, female    DOB: 1961/07/05, 60 y.o.   MRN: [id number]  HPI This visit occurred during the SARS-CoV-2 public health emergency.  Safety protocols were in place, including screening questions prior to the visit, additional usage of staff PPE, and extensive cleaning of exam room while observing appropriate contact time as indicated for disinfecting solutions.  Patient here for a scheduled follow up. Here to follow up regarding increased stress and cholesterol.  She reports she is doing relatively well.  Tries to stay active.  No chest pain or sob reported.  No abdominal pain or bowel change reported.     Past Medical History:  Diagnosis Date  . Genital warts   . GERD (gastroesophageal reflux disease)   . Hypercholesterolemia   . Vitamin D deficiency    Past Surgical History:  Procedure Laterality Date  . BREAST EXCISIONAL BIOPSY Right 1998   neg   Family History  Problem Relation Age of Onset  . Cancer Father        prostate  . Depression Father   . Heart disease Father        MI, stent  . Mental retardation Father   . Hyperlipidemia Mother   . Cancer Paternal Grandfather        prostate  . Breast cancer Neg Hx    Social History   Socioeconomic History  . Marital status: Widowed    Spouse name: Not on file  . Number of children: 2  . Years of education: Not on file  . Highest education level: Not on file  Occupational History    Employer: [employer]  Tobacco Use  . Smoking status: Former Smoker    Types: Cigarettes    Quit date: 03/2019    Years since quitting: 1.9  . Smokeless tobacco: Never Used  Substance and Sexual Activity  . Alcohol use: Yes    Alcohol/week: 0.0 standard drinks    Comment: 3-5 glasses of wine per day  . Drug use: No  . Sexual activity: Not on file  Other Topics Concern  . Not on file  Social History Narrative  . Not on  file   Social Determinants of Health   Financial Resource Strain: Not on file  Food Insecurity: Not on file  Transportation Needs: Not on file  Physical Activity: Not on file  Stress: Not on file  Social Connections: Not on file    Outpatient Encounter Medications as of 02/12/2021  Medication Sig  . cyanocobalamin (,VITAMIN B-12,) 1000 MCG/ML injection Inject 1 ml into the skin once a week for 4 weeks and then once every 30 days.  . famciclovir (FAMVIR) 500 MG tablet Take 1 tablet (500 mg total) by mouth 2 (two) times daily as needed.  . pantoprazole (PROTONIX) 20 MG tablet TAKE 1 TABLET (20 MG TOTAL) BY MOUTH DAILY.   No facility-administered encounter medications on file as of 02/12/2021.    Review of Systems  Constitutional: Negative for appetite change and unexpected weight change.  HENT: Negative for congestion and sinus pressure.   Respiratory: Negative for cough, chest tightness and shortness of breath.   Cardiovascular: Negative for chest pain, palpitations and leg swelling.  Gastrointestinal: Negative for abdominal pain, diarrhea, nausea and vomiting.  Genitourinary: Negative for difficulty urinating and dysuria.  Musculoskeletal: Negative for joint swelling and myalgias.  Skin: Negative for  color change and rash.  Neurological: Negative for dizziness, light-headedness and headaches.  Psychiatric/Behavioral: Negative for agitation and dysphoric mood.       Objective:    Physical Exam Vitals reviewed.  Constitutional:      General: She is not in acute distress.    Appearance: Normal appearance.  HENT:     Head: Normocephalic and atraumatic.     Right Ear: External ear normal.     Left Ear: External ear normal.  Eyes:     General: No scleral icterus.       Right eye: No discharge.        Left eye: No discharge.     Conjunctiva/sclera: Conjunctivae normal.  Neck:     Thyroid: No thyromegaly.  Cardiovascular:     Rate and Rhythm: Normal rate and regular  rhythm.  Pulmonary:     Effort: No respiratory distress.     Breath sounds: Normal breath sounds. No wheezing.  Abdominal:     General: Bowel sounds are normal.     Palpations: Abdomen is soft.     Tenderness: There is no abdominal tenderness.  Musculoskeletal:        General: No swelling or tenderness.     Cervical back: Neck supple. No tenderness.  Lymphadenopathy:     Cervical: No cervical adenopathy.  Skin:    Findings: No erythema or rash.  Neurological:     Mental Status: She is alert.  Psychiatric:        Mood and Affect: Mood normal.        Behavior: Behavior normal.     BP 114/72   Pulse 72   Temp 97.8 F (36.6 C)   Resp 16   Ht 5\' 7"  (1.702 m)   Wt 173 lb (78.5 kg)   LMP 03/21/2011   SpO2 99%   BMI 27.10 kg/m  Wt Readings from Last 3 Encounters:  02/12/21 173 lb (78.5 kg)  06/05/20 170 lb (77.1 kg)  12/07/19 168 lb (76.2 kg)     Lab Results  Component Value Date   WBC 5.7 12/22/2019   HGB 12.7 12/22/2019   HCT 38.1 12/22/2019   PLT 229.0 12/22/2019   GLUCOSE 84 06/05/2020   CHOL 229 (H) 06/05/2020   TRIG 187.0 (H) 06/05/2020   HDL 62.30 06/05/2020   LDLDIRECT 124.0 12/22/2019   LDLCALC 130 (H) 06/05/2020   ALT 17 09/13/2020   AST 20 09/13/2020   NA 139 06/05/2020   K 4.1 06/05/2020   CL 105 06/05/2020   CREATININE 0.61 06/05/2020   BUN 16 06/05/2020   CO2 24 06/05/2020   TSH 1.44 06/05/2020    MM 3D SCREEN BREAST BILATERAL  Result Date: 08/14/2020 CLINICAL DATA:  Screening. EXAM: DIGITAL SCREENING BILATERAL MAMMOGRAM WITH TOMO AND CAD COMPARISON:  Previous exam(s). ACR Breast Density Category c: The breast tissue is heterogeneously dense, which may obscure small masses. FINDINGS: There are no findings suspicious for malignancy. Images were processed with CAD. IMPRESSION: No mammographic evidence of malignancy. A result letter of this screening mammogram will be mailed directly to the patient. RECOMMENDATION: Screening mammogram in one year.  (Code:SM-B-01Y) BI-RADS CATEGORY  1: Negative. Electronically Signed   By: 08/16/2020 M.D.   On: 08/14/2020 13:41       Assessment & Plan:   Problem List Items Addressed This Visit    Hypercholesterolemia    Low cholesterol diet and exercise.  Follow lipid panel.       Stress  Overall she feels she is handling things well.  Follow.           Dale Gordon, MD

## 2021-02-18 ENCOUNTER — Encounter: Payer: Self-pay | Admitting: Internal Medicine

## 2021-02-18 NOTE — Assessment & Plan Note (Signed)
Low cholesterol diet and exercise.  Follow lipid panel.   

## 2021-02-18 NOTE — Assessment & Plan Note (Signed)
Overall she feels she is handling things well.  Follow.   

## 2021-04-10 ENCOUNTER — Telehealth (INDEPENDENT_AMBULATORY_CARE_PROVIDER_SITE_OTHER): Payer: Commercial Managed Care - PPO | Admitting: Family Medicine

## 2021-04-10 ENCOUNTER — Encounter: Payer: Self-pay | Admitting: Family Medicine

## 2021-04-10 DIAGNOSIS — U071 COVID-19: Secondary | ICD-10-CM | POA: Diagnosis not present

## 2021-04-10 MED ORDER — MOLNUPIRAVIR EUA 200MG CAPSULE: 4.0000 | ORAL_CAPSULE | Freq: Two times a day (BID) | ORAL | 0 refills | Status: AC

## 2021-04-10 NOTE — Progress Notes (Signed)
Virtual Visit via telephone Note  This visit type was conducted due to national recommendations for restrictions regarding the COVID-19 pandemic (e.g. social distancing).  This format is felt to be most appropriate for this patient at this time.  All issues noted in this document were discussed and addressed.  No physical exam was performed (except for noted visual exam findings with Video Visits).   I connected with Elton Sin today at  1:15 PM EDT by telephone and verified that I am speaking with the correct person using two identifiers. Location patient: home Location provider: work Persons participating in the virtual visit: patient, provider  I discussed the limitations, risks, security and privacy concerns of performing an evaluation and management service by telephone and the availability of in person appointments. I also discussed with the patient that there may be a patient responsible charge related to this service. The patient expressed understanding and agreed to proceed.  Interactive audio and video telecommunications were attempted between this provider and patient, however failed, due to patient having technical difficulties OR patient did not have access to video capability.  We continued and completed visit with audio only.   Reason for visit: COVID  HPI: COVID-19: Patient notes onset of symptoms possibly on 7/23 though definitely on 7/24.  She had a positive home COVID test on 7/25.  She has had a mild productive cough.  She has congestion in her sinuses.  She felt feverish though never checked her temperature.  She felt as though her fever broke on Tuesday.  She notes no shortness of breath.  She has had sore throat and postnasal drip.  She has had body aches, headaches, and fatigue.  No taste or smell disturbances.  She did have a COVID exposure at work last week.  She has been vaccinated and received 1 booster vaccine.  She reports this is the second time she has had  COVID.  She has tried Mucinex, Tylenol, and ibuprofen with some benefit.   ROS: See pertinent positives and negatives per HPI.  Past Medical History:  Diagnosis Date   Genital warts    GERD (gastroesophageal reflux disease)    Hypercholesterolemia    Vitamin D deficiency     Past Surgical History:  Procedure Laterality Date   BREAST EXCISIONAL BIOPSY Right 1998   neg    Family History  Problem Relation Age of Onset   Cancer Father        prostate   Depression Father    Heart disease Father        MI, stent   Mental retardation Father    Hyperlipidemia Mother    Cancer Paternal Grandfather        prostate   Breast cancer Neg Hx     SOCIAL HX: Former smoker   Current Outpatient Medications:    cyanocobalamin (,VITAMIN B-12,) 1000 MCG/ML injection, Inject 1 ml into the skin once a week for 4 weeks and then once every 30 days., Disp: 10 mL, Rfl: 0   famciclovir (FAMVIR) 500 MG tablet, Take 1 tablet (500 mg total) by mouth 2 (two) times daily as needed., Disp: 30 tablet, Rfl: 0   molnupiravir EUA 200 mg CAPS, Take 4 capsules (800 mg total) by mouth 2 (two) times daily for 5 days., Disp: 40 capsule, Rfl: 0   pantoprazole (PROTONIX) 20 MG tablet, TAKE 1 TABLET (20 MG TOTAL) BY MOUTH DAILY., Disp: 30 tablet, Rfl: 1  EXAM: This is a telephone visit and thus no  physical exam was completed.  ASSESSMENT AND PLAN:  Discussed the following assessment and plan:  Problem List Items Addressed This Visit     COVID-19    Patient with a positive home COVID test as well as symptoms consistent with COVID-19.  Discussed treatment options including paxlovid and molnupiravir.  She does not have a recent renal function thus we will choose molnupiravir.  The patient is postmenopausal.  Discussed potential adverse reactions including diarrhea, nausea, and dizziness.  Discussed that this should help reduce her risk of death and hospitalization related to COVID-19.  She will likely start to  feel better within a few days of starting this.  She works in the [employer] and notes they are dictating her quarantine and she has a follow-up test this coming Monday and if it is negative she will be able to return to work as long as she is asymptomatic.  If it is positive or if she continues to have symptoms she will be out through next Friday.  She was advised to seek medical attention for shortness of breath, cough productive of blood, and fevers of 103 F or higher that do not respond to Tylenol or ibuprofen.       Relevant Medications   molnupiravir EUA 200 mg CAPS    Return if symptoms worsen or fail to improve.   I discussed the assessment and treatment plan with the patient. The patient was provided an opportunity to ask questions and all were answered. The patient agreed with the plan and demonstrated an understanding of the instructions.   The patient was advised to call back or seek an in-person evaluation if the symptoms worsen or if the condition fails to improve as anticipated.  I provided 18 minutes of non-face-to-face time during this encounter.   Marikay Alar, MD

## 2021-04-10 NOTE — Assessment & Plan Note (Signed)
Patient with a positive home COVID test as well as symptoms consistent with COVID-19.  Discussed treatment options including paxlovid and molnupiravir.  She does not have a recent renal function thus we will choose molnupiravir.  The patient is postmenopausal.  Discussed potential adverse reactions including diarrhea, nausea, and dizziness.  Discussed that this should help reduce her risk of death and hospitalization related to COVID-19.  She will likely start to feel better within a few days of starting this.  She works in the [employer] and notes they are dictating her quarantine and she has a follow-up test this coming Monday and if it is negative she will be able to return to work as long as she is asymptomatic.  If it is positive or if she continues to have symptoms she will be out through next Friday.  She was advised to seek medical attention for shortness of breath, cough productive of blood, and fevers of 103 F or higher that do not respond to Tylenol or ibuprofen.

## 2021-06-11 ENCOUNTER — Other Ambulatory Visit: Payer: Self-pay

## 2021-06-11 ENCOUNTER — Ambulatory Visit (INDEPENDENT_AMBULATORY_CARE_PROVIDER_SITE_OTHER): Payer: Commercial Managed Care - PPO | Admitting: Internal Medicine

## 2021-06-11 ENCOUNTER — Encounter: Payer: Self-pay | Admitting: Internal Medicine

## 2021-06-11 ENCOUNTER — Other Ambulatory Visit (HOSPITAL_COMMUNITY)
Admission: RE | Admit: 2021-06-11 | Discharge: 2021-06-11 | Disposition: A | Discharge status: Home / Self Care | Payer: Commercial Managed Care - PPO | Source: Ambulatory Visit | Attending: Internal Medicine | Admitting: Internal Medicine

## 2021-06-11 VITALS — BP 128/76 | HR 69 | Temp 97.9°F | Resp 16 | Ht 67.0 in | Wt 170.0 lb

## 2021-06-11 DIAGNOSIS — Z0000 Encounter for general adult medical examination without abnormal findings: Secondary | ICD-10-CM | POA: Diagnosis not present

## 2021-06-11 DIAGNOSIS — Z1231 Encounter for screening mammogram for malignant neoplasm of breast: Secondary | ICD-10-CM | POA: Diagnosis not present

## 2021-06-11 DIAGNOSIS — E7800 Pure hypercholesterolemia, unspecified: Secondary | ICD-10-CM | POA: Diagnosis not present

## 2021-06-11 DIAGNOSIS — Z124 Encounter for screening for malignant neoplasm of cervix: Secondary | ICD-10-CM

## 2021-06-11 DIAGNOSIS — L989 Disorder of the skin and subcutaneous tissue, unspecified: Secondary | ICD-10-CM

## 2021-06-11 NOTE — Progress Notes (Signed)
Patient ID: Peggy Ward, female   DOB: 08-18-1961, 60 y.o.   MRN: [id number]   Subjective:    Patient ID: Peggy Ward, female    DOB: [birth year], 60 y.o.   MRN: [id number]  This visit occurred during the SARS-CoV-2 public health emergency.  Safety protocols were in place, including screening questions prior to the visit, additional usage of staff PPE, and extensive cleaning of exam room while observing appropriate contact time as indicated for disinfecting solutions.   Patient here for her physical exam.   Chief Complaint  Patient presents with   Annual Exam   .   HPI Here for her physical exam.  Tested positive for covid 04/08/21.  Treated with molnupiravir.  No residual problems.  Stays active.  No chest pain or sob with exertion.  No acid reflux reported.  No abdominal pian.  Bowels moving.  Discussed past diagnosis listed of genital warts. Denies any history.  No treatment.  No problems.  Overall feels good.    Past Medical History:  Diagnosis Date   GERD (gastroesophageal reflux disease)    Hypercholesterolemia    Vitamin D deficiency    Past Surgical History:  Procedure Laterality Date   BREAST EXCISIONAL BIOPSY Right 1998   neg   Family History  Problem Relation Age of Onset   Cancer Father        prostate   Depression Father    Heart disease Father        MI, stent   Mental retardation Father    Hyperlipidemia Mother    Cancer Paternal Grandfather        prostate   Breast cancer Neg Hx    Social History   Socioeconomic History   Marital status: Widowed    Spouse name: Not on file   Number of children: 2   Years of education: Not on file   Highest education level: Not on file  Occupational History    Employer: [employer]  Tobacco Use   Smoking status: Former    Types: Cigarettes    Quit date: 03/2019    Years since quitting: 2.2   Smokeless tobacco: Never  Substance and Sexual Activity   Alcohol use: Yes    Alcohol/week: 0.0 standard drinks     Comment: 3-5 glasses of wine per day   Drug use: No   Sexual activity: Not on file  Other Topics Concern   Not on file  Social History Narrative   Not on file   Social Determinants of Health   Financial Resource Strain: Not on file  Food Insecurity: Not on file  Transportation Needs: Not on file  Physical Activity: Not on file  Stress: Not on file  Social Connections: Not on file     Review of Systems  Constitutional:  Negative for appetite change and unexpected weight change.  HENT:  Negative for congestion, sinus pressure and sore throat.   Eyes:  Negative for pain and visual disturbance.  Respiratory:  Negative for cough, chest tightness and shortness of breath.   Cardiovascular:  Negative for chest pain, palpitations and leg swelling.  Gastrointestinal:  Negative for abdominal pain, diarrhea, nausea and vomiting.  Genitourinary:  Negative for difficulty urinating and dysuria.  Musculoskeletal:  Negative for joint swelling and myalgias.  Skin:  Negative for color change and rash.  Neurological:  Negative for dizziness, light-headedness and headaches.  Hematological:  Negative for adenopathy. Does not bruise/bleed easily.  Psychiatric/Behavioral:  Negative for agitation and dysphoric  mood.       Objective:     BP 128/76   Pulse 69   Temp 97.9 F (36.6 C)   Resp 16   Ht 5\' 7"  (1.702 m)   Wt 170 lb (77.1 kg)   LMP 03/21/2011   SpO2 98%   BMI 26.63 kg/m  Wt Readings from Last 3 Encounters:  06/11/21 170 lb (77.1 kg)  04/10/21 173 lb (78.5 kg)  02/12/21 173 lb (78.5 kg)    Physical Exam Vitals reviewed.  Constitutional:      General: She is not in acute distress.    Appearance: Normal appearance. She is well-developed.  HENT:     Head: Normocephalic and atraumatic.     Right Ear: External ear normal.     Left Ear: External ear normal.  Eyes:     General: No scleral icterus.       Right eye: No discharge.        Left eye: No discharge.      Conjunctiva/sclera: Conjunctivae normal.  Neck:     Thyroid: No thyromegaly.  Cardiovascular:     Rate and Rhythm: Normal rate and regular rhythm.  Pulmonary:     Effort: No tachypnea, accessory muscle usage or respiratory distress.     Breath sounds: Normal breath sounds. No decreased breath sounds or wheezing.  Chest:  Breasts:    Right: No inverted nipple, mass, nipple discharge or tenderness (no axillary adenopathy).     Left: No inverted nipple, mass, nipple discharge or tenderness (no axilarry adenopathy).  Abdominal:     General: Bowel sounds are normal.     Palpations: Abdomen is soft.     Tenderness: There is no abdominal tenderness.  Genitourinary:    Comments: Normal external genitalia.  Vaginal vault without lesions.  Cervix identified.  Pap smear performed.  Could not appreciate any adnexal masses or tenderness.   Musculoskeletal:        General: No swelling or tenderness.     Cervical back: Neck supple.  Lymphadenopathy:     Cervical: No cervical adenopathy.  Skin:    Findings: No erythema or rash.  Neurological:     Mental Status: She is alert and oriented to person, place, and time.  Psychiatric:        Mood and Affect: Mood normal.        Behavior: Behavior normal.     Outpatient Encounter Medications as of 06/11/2021  Medication Sig   cyanocobalamin (,VITAMIN B-12,) 1000 MCG/ML injection Inject 1 ml into the skin once a week for 4 weeks and then once every 30 days.   famciclovir (FAMVIR) 500 MG tablet Take 1 tablet (500 mg total) by mouth 2 (two) times daily as needed.   pantoprazole (PROTONIX) 20 MG tablet TAKE 1 TABLET (20 MG TOTAL) BY MOUTH DAILY.   No facility-administered encounter medications on file as of 06/11/2021.     Lab Results  Component Value Date   WBC 5.7 12/22/2019   HGB 12.7 12/22/2019   HCT 38.1 12/22/2019   PLT 229.0 12/22/2019   GLUCOSE 84 06/05/2020   CHOL 229 (H) 06/05/2020   TRIG 187.0 (H) 06/05/2020   HDL 62.30 06/05/2020    LDLDIRECT 124.0 12/22/2019   LDLCALC 130 (H) 06/05/2020   ALT 17 09/13/2020   AST 20 09/13/2020   NA 139 06/05/2020   K 4.1 06/05/2020   CL 105 06/05/2020   CREATININE 0.61 06/05/2020   BUN 16 06/05/2020   CO2 24  06/05/2020   TSH 1.44 06/05/2020    MM 3D SCREEN BREAST BILATERAL  Result Date: 08/14/2020 CLINICAL DATA:  Screening. EXAM: DIGITAL SCREENING BILATERAL MAMMOGRAM WITH TOMO AND CAD COMPARISON:  Previous exam(s). ACR Breast Density Category c: The breast tissue is heterogeneously dense, which may obscure small masses. FINDINGS: There are no findings suspicious for malignancy. Images were processed with CAD. IMPRESSION: No mammographic evidence of malignancy. A result letter of this screening mammogram will be mailed directly to the patient. RECOMMENDATION: Screening mammogram in one year. (Code:SM-B-01Y) BI-RADS CATEGORY  1: Negative. Electronically Signed   By: Sande Brothers M.D.   On: 08/14/2020 13:41       Assessment & Plan:   Problem List Items Addressed This Visit     Health care maintenance    Physical today 06/11/21.  PAP 06/08/19 - negative with negative HPV.  Repeat pap today 06/11/21. Mammogram 08/14/20 - Birads I.  Colonoscopy 12/2017 - recommended f/u in 5 years.        Hypercholesterolemia    Low cholesterol diet and exercise.  Follow lipid panel.       Relevant Orders   CBC with Differential/Platelet   Comprehensive metabolic panel   Lipid panel   TSH   Skin lesion    Persistent lesion - right leg - refer to dermatology.       Relevant Orders   Ambulatory referral to Dermatology   Other Visit Diagnoses     Routine general medical examination at a health care facility    -  Primary   Visit for screening mammogram       Relevant Orders   MM 3D SCREEN BREAST BILATERAL   Cervical cancer screening       Relevant Orders   Cytology - PAP( [place]) (Completed)        Dale [place], MD

## 2021-06-11 NOTE — Assessment & Plan Note (Addendum)
Physical today 06/11/21.  PAP 06/08/19 - negative with negative HPV.  Repeat pap today 06/11/21. Mammogram 08/14/20 - [facility] I.  Colonoscopy 12/2017 - recommended f/u in 5 years.

## 2021-06-11 NOTE — Assessment & Plan Note (Signed)
Low cholesterol diet and exercise.  Follow lipid panel.   

## 2021-06-12 LAB — CYTOLOGY - PAP
Comment: NEGATIVE
Diagnosis: NEGATIVE
High risk HPV: NEGATIVE

## 2021-06-16 ENCOUNTER — Encounter: Payer: Self-pay | Admitting: Internal Medicine

## 2021-06-16 DIAGNOSIS — L989 Disorder of the skin and subcutaneous tissue, unspecified: Secondary | ICD-10-CM | POA: Insufficient documentation

## 2021-06-16 NOTE — Assessment & Plan Note (Signed)
Persistent lesion - right leg - refer to dermatology.

## 2021-06-25 ENCOUNTER — Other Ambulatory Visit: Payer: Self-pay

## 2021-06-25 ENCOUNTER — Other Ambulatory Visit (INDEPENDENT_AMBULATORY_CARE_PROVIDER_SITE_OTHER): Payer: Commercial Managed Care - PPO

## 2021-06-25 DIAGNOSIS — E7800 Pure hypercholesterolemia, unspecified: Secondary | ICD-10-CM

## 2021-06-25 LAB — LIPID PANEL
Cholesterol: 233 mg/dL — ABNORMAL HIGH (ref 0–200)
HDL: 70.7 mg/dL (ref >39.00)
NonHDL: 162.67
Total CHOL/HDL Ratio: 3
Triglycerides: 203 mg/dL — ABNORMAL HIGH (ref 0.0–149.0)
VLDL: 40.6 mg/dL — ABNORMAL HIGH (ref 0.0–40.0)

## 2021-06-25 LAB — CBC WITH DIFFERENTIAL/PLATELET
Basophils Absolute: 0 10*3/uL (ref 0.0–0.1)
Basophils Relative: 1 % (ref 0.0–3.0)
Eosinophils Absolute: 0.1 10*3/uL (ref 0.0–0.7)
Eosinophils Relative: 2.1 % (ref 0.0–5.0)
HCT: 38.9 % (ref 36.0–46.0)
Hemoglobin: 12.8 g/dL (ref 12.0–15.0)
Lymphocytes Relative: 39.8 % (ref 12.0–46.0)
Lymphs Abs: 1.9 10*3/uL (ref 0.7–4.0)
MCHC: 32.8 g/dL (ref 30.0–36.0)
MCV: 99.8 fl (ref 78.0–100.0)
Monocytes Absolute: 0.4 10*3/uL (ref 0.1–1.0)
Monocytes Relative: 8.8 % (ref 3.0–12.0)
Neutro Abs: 2.3 10*3/uL (ref 1.4–7.7)
Neutrophils Relative %: 48.3 % (ref 43.0–77.0)
Platelets: 221 10*3/uL (ref 150.0–400.0)
RBC: 3.9 Mil/uL (ref 3.87–5.11)
RDW: 13.3 % (ref 11.5–15.5)
WBC: 4.8 10*3/uL (ref 4.0–10.5)

## 2021-06-25 LAB — COMPREHENSIVE METABOLIC PANEL
ALT: 25 U/L (ref 0–35)
AST: 31 U/L (ref 0–37)
Albumin: 4.3 g/dL (ref 3.5–5.2)
Alkaline Phosphatase: 62 U/L (ref 39–117)
BUN: 11 mg/dL (ref 6–23)
CO2: 27 mEq/L (ref 19–32)
Calcium: 9.1 mg/dL (ref 8.4–10.5)
Chloride: 105 mEq/L (ref 96–112)
Creatinine, Ser: 0.53 mg/dL (ref 0.40–1.20)
GFR: 99.93 mL/min (ref >60.00)
Glucose, Bld: 86 mg/dL (ref 70–99)
Potassium: 4.3 mEq/L (ref 3.5–5.1)
Sodium: 141 mEq/L (ref 135–145)
Total Bilirubin: 0.4 mg/dL (ref 0.2–1.2)
Total Protein: 7.1 g/dL (ref 6.0–8.3)

## 2021-06-25 LAB — LDL CHOLESTEROL, DIRECT: Direct LDL: 147 mg/dL

## 2021-06-25 LAB — TSH: TSH: 1.78 u[IU]/mL (ref 0.35–5.50)

## 2021-07-01 ENCOUNTER — Telehealth: Payer: Self-pay | Admitting: Internal Medicine

## 2021-07-01 NOTE — Telephone Encounter (Signed)
Patient is returning your call from 10/13,please call her at [phone].

## 2021-07-02 NOTE — Telephone Encounter (Signed)
Pt given results  

## 2021-08-19 ENCOUNTER — Other Ambulatory Visit: Payer: Self-pay

## 2021-08-19 ENCOUNTER — Ambulatory Visit
Admission: RE | Admit: 2021-08-19 | Discharge: 2021-08-19 | Disposition: A | Discharge status: Home / Self Care | Payer: Commercial Managed Care - PPO | Source: Ambulatory Visit | Attending: Internal Medicine | Admitting: Internal Medicine

## 2021-08-19 DIAGNOSIS — Z1231 Encounter for screening mammogram for malignant neoplasm of breast: Secondary | ICD-10-CM | POA: Insufficient documentation

## 2022-04-28 ENCOUNTER — Telehealth: Payer: Self-pay | Admitting: Internal Medicine

## 2022-04-28 NOTE — Telephone Encounter (Signed)
Patient was diagnosed with Bladder Infection by telemedicine last week.   She is still experiencing burning and frequent urination.  She would like know if there is something the provider could do or prescribe to help?   No available appointments until Oct.

## 2022-04-28 NOTE — Telephone Encounter (Signed)
Lm for pt to cb.

## 2022-04-29 ENCOUNTER — Encounter: Payer: Self-pay | Admitting: Internal Medicine

## 2022-04-29 ENCOUNTER — Ambulatory Visit (INDEPENDENT_AMBULATORY_CARE_PROVIDER_SITE_OTHER): Payer: Commercial Managed Care - PPO | Admitting: Internal Medicine

## 2022-04-29 VITALS — BP 134/82 | HR 77 | Temp 98.2°F | Ht 67.0 in | Wt 169.6 lb

## 2022-04-29 DIAGNOSIS — R300 Dysuria: Secondary | ICD-10-CM | POA: Diagnosis not present

## 2022-04-29 DIAGNOSIS — N3090 Cystitis, unspecified without hematuria: Secondary | ICD-10-CM | POA: Insufficient documentation

## 2022-04-29 LAB — POCT URINALYSIS DIPSTICK
Blood, UA: NEGATIVE
Glucose, UA: POSITIVE — AB
Leukocytes, UA: NEGATIVE
Nitrite, UA: POSITIVE
Protein, UA: POSITIVE — AB
Spec Grav, UA: 1.015 (ref 1.010–1.025)
Urobilinogen, UA: 4 E.U./dL — AB
pH, UA: 6 (ref 5.0–8.0)

## 2022-04-29 MED ORDER — CIPROFLOXACIN HCL 250 MG PO TABS: 250.0000 mg | ORAL_TABLET | Freq: Two times a day (BID) | ORAL | 0 refills | Status: AC

## 2022-04-29 NOTE — Assessment & Plan Note (Signed)
Treated empirically on August 5 by another provider with Septra,  And symptoms have returned .  Awaiting urine micro and culture.  Provisional rx for cipro x 7 days sent to pharmacy.  She understands she is to take ONLY if symptoms worsen before she is contacted with the results

## 2022-04-29 NOTE — Progress Notes (Signed)
Subjective:  Patient ID: Peggy Ward, female    DOB: 1961/01/30  Age: 61 y.o. MRN: [id number]  CC: The primary encounter diagnosis was Dysuria. A diagnosis of Cystitis was also pertinent to this visit.   HPI Peggy Ward presents for persistent symptoms of cystitis  Chief Complaint  Patient presents with   Urinary Tract Infection    Peggy Ward was treated empirically for UTI on August 5 via televisit with Bactrim BID x 5 days .Her symptoms were Vaginal pain, frequency,  dysuria and hematuria without fevers,  flank pain or nausea and  started 2 days before she was treated.  She reports no history of swimming or sexual activity,  but often waits "until the last minute " to empty her bladder.  Many of the symptoms resolved after completing the course of septra  on August 10,  but she has been noticing increased discomfort along with frequency  for the last several days and has resumed using AZO.  She denies any other previously mentioned symptoms.     Outpatient Medications Prior to Visit  Medication Sig Dispense Refill   famciclovir (FAMVIR) 500 MG tablet Take 1 tablet (500 mg total) by mouth 2 (two) times daily as needed. 30 tablet 0   pantoprazole (PROTONIX) 20 MG tablet TAKE 1 TABLET (20 MG TOTAL) BY MOUTH DAILY. 30 tablet 1   cyanocobalamin (,VITAMIN B-12,) 1000 MCG/ML injection Inject 1 ml into the skin once a week for 4 weeks and then once every 30 days. (Patient not taking: Reported on 04/29/2022) 10 mL 0   No facility-administered medications prior to visit.    Review of Systems;  Patient denies headache, fevers, malaise, unintentional weight loss, skin rash, eye pain, sinus congestion and sinus pain, sore throat, dysphagia,  hemoptysis , cough, dyspnea, wheezing, chest pain, palpitations, orthopnea, edema, abdominal pain, nausea, melena, diarrhea, constipation, flank pain,  hematuria,  nocturia, numbness, tingling, seizures,  Focal weakness, Loss of consciousness,  Tremor,  insomnia, depression, anxiety, and suicidal ideation.      Objective:  BP 134/82 (BP Location: Left Arm, Patient Position: Sitting, Cuff Size: Normal)   Pulse 77   Temp 98.2 F (36.8 C) (Oral)   Ht 5\' 7"  (1.702 m)   Wt 169 lb 9.6 oz (76.9 kg)   LMP 03/21/2011   SpO2 98%   BMI 26.56 kg/m   BP Readings from Last 3 Encounters:  04/29/22 134/82  06/11/21 128/76  02/12/21 114/72    Wt Readings from Last 3 Encounters:  04/29/22 169 lb 9.6 oz (76.9 kg)  06/11/21 170 lb (77.1 kg)  04/10/21 173 lb (78.5 kg)    General appearance: alert, cooperative and appears stated age Ears: normal TM's and external ear canals both ears Throat: lips, mucosa, and tongue normal; teeth and gums normal Neck: no adenopathy, no carotid bruit, supple, symmetrical, trachea midline and thyroid not enlarged, symmetric, no tenderness/mass/nodules Back: symmetric, no curvature. ROM normal. No CVA tenderness. Lungs: clear to auscultation bilaterally Heart: regular rate and rhythm, S1, S2 normal, no murmur, click, rub or gallop Abdomen: soft, non-tender; bowel sounds normal; no masses,  no organomegaly Pulses: 2+ and symmetric Skin: Skin color, texture, turgor normal. No rashes or lesions Lymph nodes: Cervical, supraclavicular, and axillary nodes normal.  No results found for: "HGBA1C"  Lab Results  Component Value Date   CREATININE 0.53 06/25/2021   CREATININE 0.61 06/05/2020   CREATININE 0.62 06/08/2019    Lab Results  Component Value Date   WBC 4.8  06/25/2021   HGB 12.8 06/25/2021   HCT 38.9 06/25/2021   PLT 221.0 06/25/2021   GLUCOSE 86 06/25/2021   CHOL 233 (H) 06/25/2021   TRIG 203.0 (H) 06/25/2021   HDL 70.70 06/25/2021   LDLDIRECT 147.0 06/25/2021   LDLCALC 130 (H) 06/05/2020   ALT 25 06/25/2021   AST 31 06/25/2021   NA 141 06/25/2021   K 4.3 06/25/2021   CL 105 06/25/2021   CREATININE 0.53 06/25/2021   BUN 11 06/25/2021   CO2 27 06/25/2021   TSH 1.78 06/25/2021    MM 3D  SCREEN BREAST BILATERAL  Result Date: 08/20/2021 CLINICAL DATA:  Screening. EXAM: DIGITAL SCREENING BILATERAL MAMMOGRAM WITH TOMOSYNTHESIS AND CAD TECHNIQUE: Bilateral screening digital craniocaudal and mediolateral oblique mammograms were obtained. Bilateral screening digital breast tomosynthesis was performed. The images were evaluated with computer-aided detection. COMPARISON:  Previous exam(s). ACR Breast Density Category b: There are scattered areas of fibroglandular density. FINDINGS: There are no findings suspicious for malignancy. IMPRESSION: No mammographic evidence of malignancy. A result letter of this screening mammogram will be mailed directly to the patient. RECOMMENDATION: Screening mammogram in one year. (Code:SM-B-01Y) BI-RADS CATEGORY  1: Negative. Electronically Signed   By: Amie Portland M.D.   On: 08/20/2021 09:30   Assessment & Plan:   Problem List Items Addressed This Visit     Cystitis    Treated empirically on August 5 by another provider with Septra,  And symptoms have returned .  Awaiting urine micro and culture.  Provisional rx for cipro x 7 days sent to pharmacy.  She understands she is to take ONLY if symptoms worsen before she is contacted with the results       Other Visit Diagnoses     Dysuria    -  Primary   Relevant Orders   POCT Urinalysis Dipstick (Completed)   Urine Culture   Urine Microscopic Only        Follow-up: No follow-ups on file.   Sherlene Shams, MD

## 2022-04-29 NOTE — Patient Instructions (Signed)
  Your  symptoms suggest that you have a resistant UTI , vs ongoing irritation due to atrophic vaginitis.    I have sent in an alternative  antibiotic  Ciprofloxacin to take twice daily for 5 days IF YOUR SYMPTOMS GET WORSE.  IT WILL TAKE 48 HOURS FOR THE CULTURE AND MICRO TO FINALIZE.    Please take a probiotic ( Align, Floraque or Culturelle), the generic version of one of these over the counter medications, or an alternative form (kombucha,  Yogurt, or another dietary source) for a minimum of 3 weeks to prevent a serious antibiotic associated diarrhea  Called clostridium dificile colitis.  Taking a probiotic may also prevent vaginitis due to yeast infections and can be continued indefinitely if you feel that it improves your digestion or your elimination (bowels).

## 2022-04-30 LAB — URINALYSIS, MICROSCOPIC ONLY

## 2022-05-01 LAB — URINE CULTURE
MICRO NUMBER:: 13781705
Result:: NO GROWTH
SPECIMEN QUALITY:: ADEQUATE

## 2022-05-20 ENCOUNTER — Ambulatory Visit (INDEPENDENT_AMBULATORY_CARE_PROVIDER_SITE_OTHER): Payer: Commercial Managed Care - PPO | Admitting: Internal Medicine

## 2022-05-20 ENCOUNTER — Encounter: Payer: Self-pay | Admitting: Internal Medicine

## 2022-05-20 VITALS — BP 110/70 | HR 71 | Temp 98.3°F | Ht 68.0 in | Wt 167.8 lb

## 2022-05-20 DIAGNOSIS — E559 Vitamin D deficiency, unspecified: Secondary | ICD-10-CM

## 2022-05-20 DIAGNOSIS — Z0000 Encounter for general adult medical examination without abnormal findings: Secondary | ICD-10-CM | POA: Diagnosis not present

## 2022-05-20 DIAGNOSIS — F439 Reaction to severe stress, unspecified: Secondary | ICD-10-CM

## 2022-05-20 DIAGNOSIS — E7800 Pure hypercholesterolemia, unspecified: Secondary | ICD-10-CM

## 2022-05-20 DIAGNOSIS — N3090 Cystitis, unspecified without hematuria: Secondary | ICD-10-CM

## 2022-05-20 DIAGNOSIS — R300 Dysuria: Secondary | ICD-10-CM | POA: Diagnosis not present

## 2022-05-20 DIAGNOSIS — N898 Other specified noninflammatory disorders of vagina: Secondary | ICD-10-CM

## 2022-05-20 LAB — TIQ-NTM

## 2022-05-20 NOTE — Progress Notes (Signed)
Subjective:    Patient ID: Peggy Ward, female    DOB: July 02, 1961, 61 y.o.   MRN: [id number]   Patient here for  Chief Complaint  Patient presents with   Annual Exam   .   HPI Here for her physical exam.  Recently evaluated for UTI.  Treated empirically with bactrim.  Persistent symptoms.  Evaluated.  Culture negative. Took cipro.  Off abx now.  Symptoms have resolved currently.  Did notice some lower abdominal pressure/urgency last week.  When symptoms started, noticed blood in urine.  Clear now.  No further hematuria.  Some vaginal discharge.  No vaginal itching or pain.  No bowel change  no abdominal pain or pressure currently.  No nausea or vomiting.  No chest pain or sob.      Past Medical History:  Diagnosis Date   GERD (gastroesophageal reflux disease)    Hypercholesterolemia    Vitamin D deficiency    Past Surgical History:  Procedure Laterality Date   BREAST EXCISIONAL BIOPSY Right 1998   neg   Family History  Problem Relation Age of Onset   Cancer Father        prostate   Depression Father    Heart disease Father        MI, stent   Mental retardation Father    Hyperlipidemia Mother    Cancer Paternal Grandfather        prostate   Breast cancer Neg Hx    Social History   Socioeconomic History   Marital status: Widowed    Spouse name: Not on file   Number of children: 2   Years of education: Not on file   Highest education level: Not on file  Occupational History    Employer: [employer]  Tobacco Use   Smoking status: Former    Types: Cigarettes    Quit date: 03/2019    Years since quitting: 3.2   Smokeless tobacco: Never  Substance and Sexual Activity   Alcohol use: Yes    Alcohol/week: 0.0 standard drinks of alcohol    Comment: 3-5 glasses of wine per day   Drug use: No   Sexual activity: Not on file  Other Topics Concern   Not on file  Social History Narrative   Not on file   Social Determinants of Health   Financial Resource Strain: Not  on file  Food Insecurity: Not on file  Transportation Needs: Not on file  Physical Activity: Not on file  Stress: Not on file  Social Connections: Not on file     Review of Systems  Constitutional:  Negative for appetite change and unexpected weight change.  HENT:  Negative for congestion, sinus pressure and sore throat.   Eyes:  Negative for pain and visual disturbance.  Respiratory:  Negative for cough, chest tightness and shortness of breath.   Cardiovascular:  Negative for chest pain, palpitations and leg swelling.  Gastrointestinal:  Negative for abdominal pain, diarrhea, nausea and vomiting.  Genitourinary:  Negative for difficulty urinating and dysuria.       Some vaginal discharge. Lower abdominal pressure last week.    Musculoskeletal:  Negative for joint swelling and myalgias.  Skin:  Negative for color change and rash.  Neurological:  Negative for dizziness and headaches.  Hematological:  Negative for adenopathy. Does not bruise/bleed easily.  Psychiatric/Behavioral:  Negative for agitation and dysphoric mood.        Objective:     BP 110/70 (BP Location: Right Arm,  Patient Position: Sitting, Cuff Size: Large)   Pulse 71   Temp 98.3 F (36.8 C) (Oral)   Ht 5\' 8"  (1.727 m)   Wt 167 lb 12.8 oz (76.1 kg)   LMP 03/21/2011   SpO2 98%   BMI 25.51 kg/m  Wt Readings from Last 3 Encounters:  05/20/22 167 lb 12.8 oz (76.1 kg)  04/29/22 169 lb 9.6 oz (76.9 kg)  06/11/21 170 lb (77.1 kg)    Physical Exam Vitals reviewed.  Constitutional:      General: She is not in acute distress.    Appearance: Normal appearance. She is well-developed.  HENT:     Head: Normocephalic and atraumatic.     Right Ear: External ear normal.     Left Ear: External ear normal.  Eyes:     General: No scleral icterus.       Right eye: No discharge.        Left eye: No discharge.     Conjunctiva/sclera: Conjunctivae normal.  Neck:     Thyroid: No thyromegaly.  Cardiovascular:      Rate and Rhythm: Normal rate and regular rhythm.  Pulmonary:     Effort: No tachypnea, accessory muscle usage or respiratory distress.     Breath sounds: Normal breath sounds. No decreased breath sounds or wheezing.  Chest:  Breasts:    Right: No inverted nipple, mass, nipple discharge or tenderness (no axillary adenopathy).     Left: No inverted nipple, mass, nipple discharge or tenderness (no axilarry adenopathy).  Abdominal:     General: Bowel sounds are normal.     Palpations: Abdomen is soft.     Tenderness: There is no abdominal tenderness.  Genitourinary:    Comments: Normal external genitalia.  Vaginal vault without lesions.  Wet prep obtained. Could not appreciate any adnexal masses or tenderness.   Musculoskeletal:        General: No swelling or tenderness.     Cervical back: Neck supple.  Lymphadenopathy:     Cervical: No cervical adenopathy.  Skin:    Findings: No erythema or rash.  Neurological:     Mental Status: She is alert and oriented to person, place, and time.  Psychiatric:        Mood and Affect: Mood normal.        Behavior: Behavior normal.      Outpatient Encounter Medications as of 05/20/2022  Medication Sig   famciclovir (FAMVIR) 500 MG tablet Take 1 tablet (500 mg total) by mouth 2 (two) times daily as needed.   pantoprazole (PROTONIX) 20 MG tablet TAKE 1 TABLET (20 MG TOTAL) BY MOUTH DAILY.   No facility-administered encounter medications on file as of 05/20/2022.     Lab Results  Component Value Date   WBC 5.3 05/27/2022   HGB 12.7 05/27/2022   HCT 38.2 05/27/2022   PLT 234.0 05/27/2022   GLUCOSE 84 05/27/2022   CHOL 218 (H) 05/27/2022   TRIG 281.0 (H) 05/27/2022   HDL 56.50 05/27/2022   LDLDIRECT 137.0 05/27/2022   LDLCALC 130 (H) 06/05/2020   ALT 19 05/27/2022   AST 25 05/27/2022   NA 142 05/27/2022   K 4.0 05/27/2022   CL 105 05/27/2022   CREATININE 0.57 05/27/2022   BUN 13 05/27/2022   CO2 28 05/27/2022   TSH 1.84 05/27/2022     MM 3D SCREEN BREAST BILATERAL  Result Date: 08/20/2021 CLINICAL DATA:  Screening. EXAM: DIGITAL SCREENING BILATERAL MAMMOGRAM WITH TOMOSYNTHESIS AND CAD TECHNIQUE: Bilateral screening  digital craniocaudal and mediolateral oblique mammograms were obtained. Bilateral screening digital breast tomosynthesis was performed. The images were evaluated with computer-aided detection. COMPARISON:  Previous exam(s). ACR Breast Density Category b: There are scattered areas of fibroglandular density. FINDINGS: There are no findings suspicious for malignancy. IMPRESSION: No mammographic evidence of malignancy. A result letter of this screening mammogram will be mailed directly to the patient. RECOMMENDATION: Screening mammogram in one year. (Code:SM-B-01Y) BI-RADS CATEGORY  1: Negative. Electronically Signed   By: Amie [place] M.D.   On: 08/20/2021 09:30      Assessment & Plan:   Problem List Items Addressed This Visit     Cystitis    Recently treated with abx.  Symptoms as outlined.  Vaginal discharge.  Recheck urine to confirm clear given abdominal pressure last week.        Health care maintenance    Physical today 05/20/22.  PAP 06/11/21 - negative with negative HPV.  Mammogram 08/19/21 - Birads I.  Colonoscopy 12/2017.  Recommended f/u in 5 years.        Hypercholesterolemia    Low cholesterol diet and exercise.  Follow lipid panel.       Relevant Orders   Hepatic function panel   Basic metabolic panel   Lipid panel   Stress    Overall appears to be handling things relatively well.  Follow.       Vitamin D deficiency    Check vitamin D level.       Other Visit Diagnoses     Dysuria    -  Primary   Relevant Orders   WET PREP FOR TRICH, YEAST, CLUE (Completed)        Dale [place], MD

## 2022-05-20 NOTE — Assessment & Plan Note (Signed)
Physical today 05/20/22.  PAP 06/11/21 - negative with negative HPV.  Mammogram 08/19/21 - [facility]ads I.  Colonoscopy 12/2017.  Recommended f/u in 5 years.

## 2022-05-22 ENCOUNTER — Telehealth: Payer: Self-pay | Admitting: Internal Medicine

## 2022-05-22 DIAGNOSIS — E7800 Pure hypercholesterolemia, unspecified: Secondary | ICD-10-CM

## 2022-05-22 DIAGNOSIS — E559 Vitamin D deficiency, unspecified: Secondary | ICD-10-CM

## 2022-05-22 DIAGNOSIS — E538 Deficiency of other specified B group vitamins: Secondary | ICD-10-CM

## 2022-05-22 NOTE — Telephone Encounter (Signed)
Patient has a lab appt 05/27/22, there are no orders in. 

## 2022-05-23 NOTE — Telephone Encounter (Signed)
Orders placed for future labs.  

## 2022-05-23 NOTE — Addendum Note (Signed)
Addended by: Charm Barges on: 05/23/2022 03:56 AM   Modules accepted: Orders

## 2022-05-27 ENCOUNTER — Other Ambulatory Visit: Payer: Self-pay | Admitting: Internal Medicine

## 2022-05-27 ENCOUNTER — Other Ambulatory Visit (INDEPENDENT_AMBULATORY_CARE_PROVIDER_SITE_OTHER): Payer: Commercial Managed Care - PPO

## 2022-05-27 DIAGNOSIS — E538 Deficiency of other specified B group vitamins: Secondary | ICD-10-CM

## 2022-05-27 DIAGNOSIS — E7800 Pure hypercholesterolemia, unspecified: Secondary | ICD-10-CM

## 2022-05-27 DIAGNOSIS — R8281 Pyuria: Secondary | ICD-10-CM

## 2022-05-27 DIAGNOSIS — E559 Vitamin D deficiency, unspecified: Secondary | ICD-10-CM

## 2022-05-27 LAB — URINALYSIS, ROUTINE W REFLEX MICROSCOPIC
Bilirubin Urine: NEGATIVE
Hgb urine dipstick: NEGATIVE
Ketones, ur: NEGATIVE
Leukocytes,Ua: NEGATIVE
Nitrite: NEGATIVE
Specific Gravity, Urine: >=1.03 — AB (ref 1.000–1.030)
Total Protein, Urine: NEGATIVE
Urine Glucose: NEGATIVE
Urobilinogen, UA: 0.2 (ref 0.0–1.0)
pH: 5.5 (ref 5.0–8.0)

## 2022-05-27 LAB — COMPREHENSIVE METABOLIC PANEL
ALT: 19 U/L (ref 0–35)
AST: 25 U/L (ref 0–37)
Albumin: 4 g/dL (ref 3.5–5.2)
Alkaline Phosphatase: 61 U/L (ref 39–117)
BUN: 13 mg/dL (ref 6–23)
CO2: 28 mEq/L (ref 19–32)
Calcium: 8.6 mg/dL (ref 8.4–10.5)
Chloride: 105 mEq/L (ref 96–112)
Creatinine, Ser: 0.57 mg/dL (ref 0.40–1.20)
GFR: 97.56 mL/min (ref >60.00)
Glucose, Bld: 84 mg/dL (ref 70–99)
Potassium: 4 mEq/L (ref 3.5–5.1)
Sodium: 142 mEq/L (ref 135–145)
Total Bilirubin: 0.3 mg/dL (ref 0.2–1.2)
Total Protein: 6.9 g/dL (ref 6.0–8.3)

## 2022-05-27 LAB — CBC WITH DIFFERENTIAL/PLATELET
Basophils Absolute: 0 10*3/uL (ref 0.0–0.1)
Basophils Relative: 0.4 % (ref 0.0–3.0)
Eosinophils Absolute: 0.1 10*3/uL (ref 0.0–0.7)
Eosinophils Relative: 2 % (ref 0.0–5.0)
HCT: 38.2 % (ref 36.0–46.0)
Hemoglobin: 12.7 g/dL (ref 12.0–15.0)
Lymphocytes Relative: 44.8 % (ref 12.0–46.0)
Lymphs Abs: 2.4 10*3/uL (ref 0.7–4.0)
MCHC: 33.4 g/dL (ref 30.0–36.0)
MCV: 99.2 fl (ref 78.0–100.0)
Monocytes Absolute: 0.4 10*3/uL (ref 0.1–1.0)
Monocytes Relative: 6.7 % (ref 3.0–12.0)
Neutro Abs: 2.4 10*3/uL (ref 1.4–7.7)
Neutrophils Relative %: 46.1 % (ref 43.0–77.0)
Platelets: 234 10*3/uL (ref 150.0–400.0)
RBC: 3.85 Mil/uL — ABNORMAL LOW (ref 3.87–5.11)
RDW: 13 % (ref 11.5–15.5)
WBC: 5.3 10*3/uL (ref 4.0–10.5)

## 2022-05-27 LAB — LDL CHOLESTEROL, DIRECT: Direct LDL: 137 mg/dL

## 2022-05-27 LAB — LIPID PANEL
Cholesterol: 218 mg/dL — ABNORMAL HIGH (ref 0–200)
HDL: 56.5 mg/dL (ref >39.00)
NonHDL: 161.92
Total CHOL/HDL Ratio: 4
Triglycerides: 281 mg/dL — ABNORMAL HIGH (ref 0.0–149.0)
VLDL: 56.2 mg/dL — ABNORMAL HIGH (ref 0.0–40.0)

## 2022-05-27 LAB — VITAMIN D 25 HYDROXY (VIT D DEFICIENCY, FRACTURES): VITD: 19.63 ng/mL — ABNORMAL LOW (ref 30.00–100.00)

## 2022-05-27 LAB — SURESWAB® ADVANCED BACTERIAL VAGINOSIS (BV), TMA: SURESWAB(R) ADV BACTERIAL VAGINOSIS(BV),TMA: NEGATIVE

## 2022-05-27 LAB — TSH: TSH: 1.84 u[IU]/mL (ref 0.35–5.50)

## 2022-05-27 LAB — VITAMIN B12: Vitamin B-12: 90 pg/mL — ABNORMAL LOW (ref 211–911)

## 2022-05-27 LAB — SURESWAB® ADVANCED CANDIDA VAGINITIS (CV), TMA
CANDIDA SPECIES: NOT DETECTED
Candida glabrata: NOT DETECTED

## 2022-05-27 LAB — WET PREP FOR TRICH, YEAST, CLUE

## 2022-05-27 NOTE — Progress Notes (Signed)
Order placed for f/u urine 

## 2022-05-28 ENCOUNTER — Other Ambulatory Visit: Payer: Self-pay | Admitting: Internal Medicine

## 2022-05-28 ENCOUNTER — Telehealth: Payer: Self-pay

## 2022-05-28 DIAGNOSIS — R8281 Pyuria: Secondary | ICD-10-CM

## 2022-05-28 NOTE — Telephone Encounter (Signed)
LMTCB in regards to lab results.  

## 2022-05-28 NOTE — Telephone Encounter (Signed)
-----   Message from Dale [place], MD sent at 05/28/2022  4:24 AM EDT ----- Notify - B12 is low.  Needs B12 injections - q week x 4 weeks and then q month.  Vitamin D level is low.  If not taking any vitamin  D, then I would like to send in rx for ergocalciferol 50,000 units q week x 3 months (this is a prescription vitamin D) and then continue on vitamin D3 2000 units per day (once rx complete).  I can send in rx if agreeable to take.  Triglycerides/cholesterol elevated.  Low carb diet and exercise.  Send [facility] lipid diet.  We will follow.  Hgb, thyroid test, kidney function tests and liver function tests are wnl.  Urine culture pending.  Will notify once results available.

## 2022-05-28 NOTE — Progress Notes (Signed)
Order placed for urine culture 

## 2022-05-29 ENCOUNTER — Telehealth: Payer: Self-pay

## 2022-05-29 ENCOUNTER — Encounter: Payer: Self-pay | Admitting: Internal Medicine

## 2022-05-29 NOTE — Telephone Encounter (Signed)
Patient states she is returning our call regarding her results.  Patient asked that we please call her back after 2pm today.

## 2022-05-29 NOTE — Telephone Encounter (Signed)
LVM to call back to office to go over results 

## 2022-05-30 ENCOUNTER — Other Ambulatory Visit (INDEPENDENT_AMBULATORY_CARE_PROVIDER_SITE_OTHER): Payer: Commercial Managed Care - PPO

## 2022-05-30 DIAGNOSIS — R8281 Pyuria: Secondary | ICD-10-CM

## 2022-05-30 MED ORDER — CYANOCOBALAMIN 1000 MCG/ML IJ SOLN: INTRAMUSCULAR | 0 refills | Status: DC

## 2022-05-30 MED ORDER — "EASY TOUCH FLIPLOCK NEEDLES 25G X 1"" MISC": 0 refills | Status: DC

## 2022-05-30 MED ORDER — VITAMIN D (ERGOCALCIFEROL) 1.25 MG (50000 UNIT) PO CAPS: 50000.0000 [IU] | ORAL_CAPSULE | ORAL | 0 refills | Status: DC

## 2022-05-30 MED ORDER — "BD ECLIPSE SYRINGE 25G X 1"" 3 ML MISC": 0 refills | Status: DC

## 2022-05-30 NOTE — Telephone Encounter (Signed)
Called Peggy Ward and discussed labs.  Discussed vitamin D supplements and B12 replacement.  Rx sent in to pharmacy.

## 2022-05-30 NOTE — Telephone Encounter (Signed)
Called and spoke to Peggy Ward regarding labs.  Also discussed her prescriptions.  Ergocalciferol rx sent in.  Can you send in rx for B12 injections q week x 4 weeks and then q month.

## 2022-05-30 NOTE — Telephone Encounter (Signed)
Spoke to patient regarding her results and patient stated that she was ok with you placing the referral to GYN

## 2022-05-30 NOTE — Telephone Encounter (Signed)
Patient called about MyChart message below.

## 2022-05-30 NOTE — Telephone Encounter (Signed)
Pt came into the office requesting an update on her medication

## 2022-05-31 LAB — URINE CULTURE
MICRO NUMBER:: 13923926
Result:: NO GROWTH
SPECIMEN QUALITY:: ADEQUATE

## 2022-06-01 ENCOUNTER — Encounter: Payer: Self-pay | Admitting: Internal Medicine

## 2022-06-01 DIAGNOSIS — N898 Other specified noninflammatory disorders of vagina: Secondary | ICD-10-CM | POA: Insufficient documentation

## 2022-06-01 NOTE — Assessment & Plan Note (Signed)
Overall appears to be handling things relatively well.  Follow.   

## 2022-06-01 NOTE — Assessment & Plan Note (Signed)
Pelvic exam performed.  KOH/wet prep obtained.

## 2022-06-01 NOTE — Assessment & Plan Note (Signed)
Low cholesterol diet and exercise.  Follow lipid panel.   

## 2022-06-01 NOTE — Assessment & Plan Note (Signed)
Recently treated with abx.  Symptoms as outlined.  Vaginal discharge.  Recheck urine to confirm clear given abdominal pressure last week.

## 2022-06-01 NOTE — Assessment & Plan Note (Signed)
Check vitamin D level 

## 2022-06-06 ENCOUNTER — Encounter: Payer: Self-pay | Admitting: Internal Medicine

## 2022-06-06 DIAGNOSIS — N949 Unspecified condition associated with female genital organs and menstrual cycle: Secondary | ICD-10-CM

## 2022-06-06 NOTE — Telephone Encounter (Signed)
Order placed for gyn referral to Dr Hulan Fray

## 2022-06-12 ENCOUNTER — Telehealth: Payer: Self-pay

## 2022-06-12 NOTE — Telephone Encounter (Signed)
Patient returned office phone call, note was read. Patient received message by MyChart over a week ago.

## 2022-06-12 NOTE — Telephone Encounter (Signed)
LMTCB for Just got your message.  AZO will help with bladder spasms.  It is ok if you try AZO tonight if you are having increased pain.  If the symptoms are worsening, you will need to be seen prior to the gyn appointment.     Please call and see how she is doing and see if she read her my chart

## 2022-06-23 ENCOUNTER — Encounter: Payer: Self-pay | Admitting: Internal Medicine

## 2022-06-23 NOTE — Telephone Encounter (Signed)
I will email our [facility] rep for assistance

## 2022-06-23 NOTE — Telephone Encounter (Signed)
Ok. Thank you.

## 2022-06-23 NOTE — Telephone Encounter (Signed)
Can one of you help with this.  This was just supposed to be wet prep/koh - like we always order.  Thanks.

## 2022-06-23 NOTE — Telephone Encounter (Signed)
I believe this is the patient were the wrong swab was used, so per your request we ordered the test that could be ran off of it. We don't deal with billing in the lab. But this does not mention anything experimental when I looked up those codes.

## 2022-06-26 ENCOUNTER — Encounter: Payer: Self-pay | Admitting: Certified Nurse Midwife

## 2022-06-26 ENCOUNTER — Ambulatory Visit: Payer: Commercial Managed Care - PPO | Admitting: Certified Nurse Midwife

## 2022-06-26 ENCOUNTER — Other Ambulatory Visit (HOSPITAL_COMMUNITY)
Admission: RE | Admit: 2022-06-26 | Discharge: 2022-06-26 | Disposition: A | Discharge status: Home / Self Care | Payer: Commercial Managed Care - PPO | Source: Ambulatory Visit | Attending: Certified Nurse Midwife | Admitting: Certified Nurse Midwife

## 2022-06-26 VITALS — BP 133/81 | HR 87 | Resp 16 | Wt 171.7 lb

## 2022-06-26 DIAGNOSIS — N949 Unspecified condition associated with female genital organs and menstrual cycle: Secondary | ICD-10-CM | POA: Insufficient documentation

## 2022-06-26 DIAGNOSIS — N9489 Other specified conditions associated with female genital organs and menstrual cycle: Secondary | ICD-10-CM

## 2022-06-26 LAB — POCT URINALYSIS DIPSTICK
Bilirubin, UA: NEGATIVE
Blood, UA: NEGATIVE
Glucose, UA: NEGATIVE
Ketones, UA: NEGATIVE
Leukocytes, UA: NEGATIVE
Nitrite, UA: NEGATIVE
Protein, UA: POSITIVE — AB
Spec Grav, UA: 1.015 (ref 1.010–1.025)
Urobilinogen, UA: 0.2 E.U./dL
pH, UA: 6.5 (ref 5.0–8.0)

## 2022-06-26 NOTE — Progress Notes (Signed)
GYN ENCOUNTER NOTE  Subjective:       Peggy Ward is a 61 y.o. No obstetric history on file. female is here for gynecologic evaluation of the following issues:  1. Vaginal burning and dysuria for the last month. She was treated forUTI earlier in the the month but continued to have symptoms , she went to her PCP her urine culture was negative so she was instructed to stop antibiotics and was referred. She notices a odor but feels like it is her urine. She is not sexually active x 3 yrs ( widowed) .     Gynecologic History Patient's last menstrual period was 03/21/2011. Contraception: post menopausal status Last Pap: 06/11/21. Results were: normal Last mammogram: 08/20/2021. Results were: normal  Obstetric History OB History  No obstetric history on file.    Past Medical History:  Diagnosis Date   GERD (gastroesophageal reflux disease)    Hypercholesterolemia    Vitamin D deficiency     Past Surgical History:  Procedure Laterality Date   BREAST EXCISIONAL BIOPSY Right 1998   neg    Current Outpatient Medications on File Prior to Visit  Medication Sig Dispense Refill   cyanocobalamin (VITAMIN B12) 1000 MCG/ML injection Inject 1,000 mg (78mL) once weekly for four week, then inject once monthly thereafter. 10 mL 0   famciclovir (FAMVIR) 500 MG tablet Take 1 tablet (500 mg total) by mouth 2 (two) times daily as needed. 30 tablet 0   NEEDLE, DISP, 25 G (EASY TOUCH FLIPLOCK NEEDLES) 25G X 1" MISC Use to give b12 injections 50 each 0   pantoprazole (PROTONIX) 20 MG tablet TAKE 1 TABLET (20 MG TOTAL) BY MOUTH DAILY. 30 tablet 1   SYRINGE-NEEDLE, DISP, 3 ML (BD ECLIPSE SYRINGE) 25G X 1" 3 ML MISC Use to give B12 injections. 50 each 0   Vitamin D, Ergocalciferol, (DRISDOL) 1.25 MG (50000 UNIT) CAPS capsule Take 1 capsule (50,000 Units total) by mouth every 7 (seven) days. 12 capsule 0   No current facility-administered medications on file prior to visit.    No Known  Allergies  Social History   Socioeconomic History   Marital status: Widowed    Spouse name: Not on file   Number of children: 2   Years of education: Not on file   Highest education level: Not on file  Occupational History    Employer: [employer]  Tobacco Use   Smoking status: Former    Types: Cigarettes    Quit date: 03/2019    Years since quitting: 3.2   Smokeless tobacco: Never  Substance and Sexual Activity   Alcohol use: Yes    Alcohol/week: 0.0 standard drinks of alcohol    Comment: 3-5 glasses of wine per day   Drug use: No   Sexual activity: Not on file  Other Topics Concern   Not on file  Social History Narrative   Not on file   Social Determinants of Health   Financial Resource Strain: Not on file  Food Insecurity: Not on file  Transportation Needs: Not on file  Physical Activity: Not on file  Stress: Not on file  Social Connections: Not on file  Intimate Partner Violence: Not on file    Family History  Problem Relation Age of Onset   Cancer Father        prostate   Depression Father    Heart disease Father        MI, stent   Mental retardation Father  Hyperlipidemia Mother    Cancer Paternal Grandfather        prostate   Breast cancer Neg Hx     The following portions of the patient's history were reviewed and updated as appropriate: allergies, current medications, past family history, past medical history, past social history, past surgical history and problem list.  Review of Systems Review of Systems - Negative except as mentioned in HPI Review of Systems - General ROS: negative for - chills, fatigue, fever, hot flashes, malaise or night sweats Hematological and Lymphatic ROS: negative for - bleeding problems or swollen lymph nodes Gastrointestinal ROS: negative for - abdominal pain, blood in stools, change in bowel habits and nausea/vomiting Musculoskeletal ROS: negative for - joint pain, muscle pain or muscular weakness Genito-Urinary ROS:  negative for - change in menstrual cycle, dysmenorrhea, dyspareunia, dysuria, genital discharge, genital ulcers, hematuria, incontinence, irregular/heavy menses, nocturia or pelvic pain. Positive for vaginal burning, burning with urination, urinary odor, cloudy urine x 1 month.   Objective:   BP 133/81   Pulse 87   Resp 16   Wt 171 lb 11.2 oz (77.9 kg)   LMP 03/21/2011   BMI 26.11 kg/m  CONSTITUTIONAL: Well-developed, well-nourished female in no acute distress.  HENT:  Normocephalic, atraumatic.  NECK: Normal range of motion, supple, no masses.  Normal thyroid.  SKIN: Skin is warm and dry. No rash noted. Not diaphoretic. No erythema. No pallor. Bostic: Alert and oriented to person, place, and time. PSYCHIATRIC: Normal mood and affect. Normal behavior. Normal judgment and thought content. CARDIOVASCULAR:Not Examined RESPIRATORY: Not Examined BREASTS: Not Examined ABDOMEN: Soft, non distended; Non tender.  No Organomegaly. PELVIC:  External Genitalia: Normal  BUS: Normal  Vagina: Normal , small amount of white discharge noted, no odor. Normal atrophic changes.   Cervix: Normal MUSCULOSKELETAL: Normal range of motion. No tenderness.  No cyanosis, clubbing, or edema.     Assessment:   Vaginal burning   Plan:   Discussed possible causes including menopausal changes in vaginal and bladder tissue and infection. Swab collected. Discussed use of local estrogen to treat should urine culture and swab be negative. Information given on HRT. She will let me know if she is interested in starting medication. Will follow up with results.   Philip Aspen, CNM

## 2022-06-28 LAB — URINE CULTURE

## 2022-06-30 ENCOUNTER — Encounter: Payer: Self-pay | Admitting: Certified Nurse Midwife

## 2022-06-30 ENCOUNTER — Other Ambulatory Visit: Payer: Self-pay | Admitting: Certified Nurse Midwife

## 2022-06-30 LAB — CERVICOVAGINAL ANCILLARY ONLY
Bacterial Vaginitis (gardnerella): NEGATIVE
Candida Glabrata: NEGATIVE
Candida Vaginitis: NEGATIVE
Comment: NEGATIVE
Comment: NEGATIVE
Comment: NEGATIVE

## 2022-07-01 ENCOUNTER — Other Ambulatory Visit: Payer: Self-pay | Admitting: Certified Nurse Midwife

## 2022-07-01 MED ORDER — ESTRADIOL 0.1 MG/GM VA CREA: 1.0000 | TOPICAL_CREAM | Freq: Every day | VAGINAL | 12 refills | Status: DC

## 2022-08-29 ENCOUNTER — Other Ambulatory Visit: Payer: Self-pay | Admitting: Internal Medicine

## 2022-11-18 ENCOUNTER — Ambulatory Visit: Payer: Commercial Managed Care - PPO | Admitting: Internal Medicine

## 2023-04-30 ENCOUNTER — Encounter (INDEPENDENT_AMBULATORY_CARE_PROVIDER_SITE_OTHER): Payer: Self-pay

## 2023-05-22 ENCOUNTER — Encounter: Payer: Self-pay | Admitting: Internal Medicine

## 2023-05-22 ENCOUNTER — Ambulatory Visit (INDEPENDENT_AMBULATORY_CARE_PROVIDER_SITE_OTHER): Payer: Commercial Managed Care - PPO | Admitting: Internal Medicine

## 2023-05-22 VITALS — BP 122/72 | HR 65 | Temp 98.3°F | Ht 67.0 in | Wt 165.2 lb

## 2023-05-22 DIAGNOSIS — E559 Vitamin D deficiency, unspecified: Secondary | ICD-10-CM | POA: Diagnosis not present

## 2023-05-22 DIAGNOSIS — K649 Unspecified hemorrhoids: Secondary | ICD-10-CM

## 2023-05-22 DIAGNOSIS — F439 Reaction to severe stress, unspecified: Secondary | ICD-10-CM

## 2023-05-22 DIAGNOSIS — E7800 Pure hypercholesterolemia, unspecified: Secondary | ICD-10-CM

## 2023-05-22 DIAGNOSIS — Z0000 Encounter for general adult medical examination without abnormal findings: Secondary | ICD-10-CM

## 2023-05-22 DIAGNOSIS — Z1231 Encounter for screening mammogram for malignant neoplasm of breast: Secondary | ICD-10-CM

## 2023-05-22 DIAGNOSIS — Z1211 Encounter for screening for malignant neoplasm of colon: Secondary | ICD-10-CM

## 2023-05-22 LAB — BASIC METABOLIC PANEL
BUN: 12 mg/dL (ref 6–23)
CO2: 27 meq/L (ref 19–32)
Calcium: 8.9 mg/dL (ref 8.4–10.5)
Chloride: 107 meq/L (ref 96–112)
Creatinine, Ser: 0.54 mg/dL (ref 0.40–1.20)
GFR: 98.16 mL/min (ref >60.00)
Glucose, Bld: 86 mg/dL (ref 70–99)
Potassium: 3.9 meq/L (ref 3.5–5.1)
Sodium: 142 meq/L (ref 135–145)

## 2023-05-22 LAB — CBC WITH DIFFERENTIAL/PLATELET
Basophils Absolute: 0 10*3/uL (ref 0.0–0.1)
Basophils Relative: 0.9 % (ref 0.0–3.0)
Eosinophils Absolute: 0.1 10*3/uL (ref 0.0–0.7)
Eosinophils Relative: 1.5 % (ref 0.0–5.0)
HCT: 39.2 % (ref 36.0–46.0)
Hemoglobin: 12.5 g/dL (ref 12.0–15.0)
Lymphocytes Relative: 30.8 % (ref 12.0–46.0)
Lymphs Abs: 1.7 10*3/uL (ref 0.7–4.0)
MCHC: 32 g/dL (ref 30.0–36.0)
MCV: 100.4 fl — ABNORMAL HIGH (ref 78.0–100.0)
Monocytes Absolute: 0.4 10*3/uL (ref 0.1–1.0)
Monocytes Relative: 7.6 % (ref 3.0–12.0)
Neutro Abs: 3.2 10*3/uL (ref 1.4–7.7)
Neutrophils Relative %: 59.2 % (ref 43.0–77.0)
Platelets: 230 10*3/uL (ref 150.0–400.0)
RBC: 3.9 Mil/uL (ref 3.87–5.11)
RDW: 13.1 % (ref 11.5–15.5)
WBC: 5.4 10*3/uL (ref 4.0–10.5)

## 2023-05-22 LAB — VITAMIN D 25 HYDROXY (VIT D DEFICIENCY, FRACTURES): VITD: 28.26 ng/mL — ABNORMAL LOW (ref 30.00–100.00)

## 2023-05-22 LAB — HEPATIC FUNCTION PANEL
ALT: 16 U/L (ref 0–35)
AST: 21 U/L (ref 0–37)
Albumin: 4.1 g/dL (ref 3.5–5.2)
Alkaline Phosphatase: 57 U/L (ref 39–117)
Bilirubin, Direct: 0 mg/dL (ref 0.0–0.3)
Total Bilirubin: 0.3 mg/dL (ref 0.2–1.2)
Total Protein: 7.3 g/dL (ref 6.0–8.3)

## 2023-05-22 LAB — LIPID PANEL
Cholesterol: 221 mg/dL — ABNORMAL HIGH (ref 0–200)
HDL: 64 mg/dL (ref >39.00)
LDL Cholesterol: 127 mg/dL — ABNORMAL HIGH (ref 0–99)
NonHDL: 157.19
Total CHOL/HDL Ratio: 3
Triglycerides: 149 mg/dL (ref 0.0–149.0)
VLDL: 29.8 mg/dL (ref 0.0–40.0)

## 2023-05-22 LAB — TSH: TSH: 0.72 u[IU]/mL (ref 0.35–5.50)

## 2023-05-22 NOTE — Progress Notes (Unsigned)
Subjective:    Patient ID: PRESSLEY VALENZANO, female    DOB: 08-Mar-1961, 62 y.o.   MRN: [id number]  Patient here for  Chief Complaint  Patient presents with   Annual Exam    HPI Here for a physical exam. Was having issues with vaginal burning.  Saw gyn 06/2022.  Started on vaginal estrogen. Uses prn.  Helps.  No vaginal problems reported. Working.  Handling stress.  No chest pain or sob reported.  No abdominal pain or bowel change reported.  Some issues with hemorrhoids.  Occasional bleeding - spot - on tissue when wiping - noticed on a couple of occasions.  Denies increased constipation or straining.     Past Medical History:  Diagnosis Date   GERD (gastroesophageal reflux disease)    Hypercholesterolemia    Vitamin D deficiency    Past Surgical History:  Procedure Laterality Date   BREAST EXCISIONAL BIOPSY Right 1998   neg   Family History  Problem Relation Age of Onset   Cancer Father        prostate   Depression Father    Heart disease Father        MI, stent   Mental retardation Father    Hyperlipidemia Mother    Cancer Paternal Grandfather        prostate   Breast cancer Neg Hx    Social History   Socioeconomic History   Marital status: Widowed    Spouse name: Not on file   Number of children: 2   Years of education: Not on file   Highest education level: Not on file  Occupational History    Employer: [employer]  Tobacco Use   Smoking status: Former    Current packs/day: 0.00    Types: Cigarettes    Quit date: 03/2019    Years since quitting: 4.1   Smokeless tobacco: Never  Substance and Sexual Activity   Alcohol use: Yes    Alcohol/week: 0.0 standard drinks of alcohol    Comment: 3-5 glasses of wine per day   Drug use: No   Sexual activity: Not on file  Other Topics Concern   Not on file  Social History Narrative   Not on file   Social Determinants of Health   Financial Resource Strain: Not on file  Food Insecurity: Not on file  Transportation  Needs: Not on file  Physical Activity: Not on file  Stress: Not on file  Social Connections: Not on file     Review of Systems  Constitutional:  Negative for appetite change and unexpected weight change.  HENT:  Negative for congestion, sinus pressure and sore throat.   Eyes:  Negative for pain and visual disturbance.  Respiratory:  Negative for cough, chest tightness and shortness of breath.   Cardiovascular:  Negative for chest pain, palpitations and leg swelling.  Gastrointestinal:  Negative for abdominal pain, diarrhea, nausea and vomiting.  Genitourinary:  Negative for difficulty urinating and dysuria.  Musculoskeletal:  Negative for joint swelling and myalgias.  Skin:  Negative for color change and rash.  Neurological:  Negative for dizziness and headaches.  Hematological:  Negative for adenopathy. Does not bruise/bleed easily.  Psychiatric/Behavioral:  Negative for agitation and dysphoric mood.        Objective:     BP 122/72   Pulse 65   Temp 98.3 F (36.8 C) (Oral)   Ht 5\' 7"  (1.702 m)   Wt 165 lb 3.2 oz (74.9 kg)   LMP 03/21/2011  SpO2 96%   BMI 25.87 kg/m  Wt Readings from Last 3 Encounters:  05/22/23 165 lb 3.2 oz (74.9 kg)  06/26/22 171 lb 11.2 oz (77.9 kg)  05/20/22 167 lb 12.8 oz (76.1 kg)    Physical Exam Vitals reviewed.  Constitutional:      General: She is not in acute distress.    Appearance: Normal appearance. She is well-developed.  HENT:     Head: Normocephalic and atraumatic.     Right Ear: External ear normal.     Left Ear: External ear normal.  Eyes:     General: No scleral icterus.       Right eye: No discharge.        Left eye: No discharge.     Conjunctiva/sclera: Conjunctivae normal.  Neck:     Thyroid: No thyromegaly.  Cardiovascular:     Rate and Rhythm: Normal rate and regular rhythm.  Pulmonary:     Effort: No tachypnea, accessory muscle usage or respiratory distress.     Breath sounds: Normal breath sounds. No decreased  breath sounds or wheezing.  Chest:  Breasts:    Right: No inverted nipple, mass, nipple discharge or tenderness (no axillary adenopathy).     Left: No inverted nipple, mass, nipple discharge or tenderness (no axilarry adenopathy).  Abdominal:     General: Bowel sounds are normal.     Palpations: Abdomen is soft.     Tenderness: There is no abdominal tenderness.  Musculoskeletal:        General: No swelling or tenderness.     Cervical back: Neck supple.  Lymphadenopathy:     Cervical: No cervical adenopathy.  Skin:    Findings: No erythema or rash.  Neurological:     Mental Status: She is alert and oriented to person, place, and time.  Psychiatric:        Mood and Affect: Mood normal.        Behavior: Behavior normal.      Outpatient Encounter Medications as of 05/22/2023  Medication Sig   cyanocobalamin (VITAMIN B12) 1000 MCG/ML injection Inject 1,000 mg (1mL) once weekly for four week, then inject once monthly thereafter.   estradiol (ESTRACE VAGINAL) 0.1 MG/GM vaginal cream Place 1 Applicatorful vaginally at bedtime for 14 days. Daily x 14 days,  then twice a week   famciclovir (FAMVIR) 500 MG tablet Take 1 tablet (500 mg total) by mouth 2 (two) times daily as needed.   pantoprazole (PROTONIX) 20 MG tablet TAKE 1 TABLET (20 MG TOTAL) BY MOUTH DAILY.   Vitamin D, Ergocalciferol, (DRISDOL) 1.25 MG (50000 UNIT) CAPS capsule Take 1 capsule (50,000 Units total) by mouth every 7 (seven) days.   [DISCONTINUED] NEEDLE, DISP, 25 G (EASY TOUCH FLIPLOCK NEEDLES) 25G X 1" MISC Use to give b12 injections   [DISCONTINUED] SYRINGE-NEEDLE, DISP, 3 ML (BD ECLIPSE SYRINGE) 25G X 1" 3 ML MISC Use to give B12 injections.   No facility-administered encounter medications on file as of 05/22/2023.     Lab Results  Component Value Date   WBC 5.4 05/22/2023   HGB 12.5 05/22/2023   HCT 39.2 05/22/2023   PLT 230.0 05/22/2023   GLUCOSE 86 05/22/2023   CHOL 221 (H) 05/22/2023   TRIG 149.0  05/22/2023   HDL 64.00 05/22/2023   LDLDIRECT 137.0 05/27/2022   LDLCALC 127 (H) 05/22/2023   ALT 16 05/22/2023   AST 21 05/22/2023   NA 142 05/22/2023   K 3.9 05/22/2023   CL 107  05/22/2023   CREATININE 0.54 05/22/2023   BUN 12 05/22/2023   CO2 27 05/22/2023   TSH 0.72 05/22/2023       Assessment & Plan:  Routine general medical examination at a health care facility  Visit for screening mammogram -     3D Screening Mammogram, Left and Right; Future  Health care maintenance Assessment & Plan: Physical today 05/22/23.  PAP 06/11/21 - negative with negative HPV.  Mammogram 08/19/21 - Birads I.  Overdue. Scheduled for 06/10/23. Colonoscopy 12/2017.  Recommended f/u in 5 years. Due f/u. Discussed.  Agreeable for referral.    Hypercholesterolemia Assessment & Plan: Low cholesterol diet and exercise.  Follow lipid panel.   Orders: -     CBC with Differential/Platelet -     Basic metabolic panel -     Hepatic function panel -     TSH -     Lipid panel  Vitamin D deficiency Assessment & Plan: Check vitamin D level.   Orders: -     VITAMIN D 25 Hydroxy (Vit-D Deficiency, Fractures)  Colon cancer screening -     Ambulatory referral to Gastroenterology  Stress Assessment & Plan: Overall appears to be handling things relatively well.  Follow.    Hemorrhoids, unspecified hemorrhoid type Assessment & Plan: Reports some issues with hemorrhoids.  Occasional bleeding - spot - on tissue when wiping - noticed on a couple of occasions.  Denies increased constipation or straining.  Due f/u colonoscopy.  Referral placed.  No acute issues now.       Dale Idabel, MD

## 2023-05-22 NOTE — Assessment & Plan Note (Signed)
Physical today 05/22/23.  PAP 06/11/21 - negative with negative HPV.  Mammogram 08/19/21 - Birads I.  Overdue. Scheduled for 06/10/23. Colonoscopy 12/2017.  Recommended f/u in 5 years. Due f/u. Discussed.  Agreeable for referral.

## 2023-05-24 ENCOUNTER — Encounter: Payer: Self-pay | Admitting: Internal Medicine

## 2023-05-24 DIAGNOSIS — K649 Unspecified hemorrhoids: Secondary | ICD-10-CM | POA: Insufficient documentation

## 2023-05-24 NOTE — Assessment & Plan Note (Signed)
Overall appears to be handling things relatively well.  Follow.   

## 2023-05-24 NOTE — Assessment & Plan Note (Signed)
Reports some issues with hemorrhoids.  Occasional bleeding - spot - on tissue when wiping - noticed on a couple of occasions.  Denies increased constipation or straining.  Due f/u colonoscopy.  Referral placed.  No acute issues now.

## 2023-05-24 NOTE — Assessment & Plan Note (Signed)
Low cholesterol diet and exercise.  Follow lipid panel.   

## 2023-05-24 NOTE — Assessment & Plan Note (Signed)
Check vitamin D level 

## 2023-05-25 ENCOUNTER — Encounter: Payer: Self-pay | Admitting: Internal Medicine

## 2023-05-25 DIAGNOSIS — E538 Deficiency of other specified B group vitamins: Secondary | ICD-10-CM

## 2023-05-25 DIAGNOSIS — R718 Other abnormality of red blood cells: Secondary | ICD-10-CM

## 2023-05-25 NOTE — Telephone Encounter (Signed)
LMTCB

## 2023-05-25 NOTE — Telephone Encounter (Signed)
I will let her know that the TSH and RBC are normal on this blood draw but she is asking about her MCV. Her level was 100.4

## 2023-05-25 NOTE — Telephone Encounter (Signed)
Patient states she is returning a call from Rita Ohara, LPN.  I read message from Dr. Dale Keene to patient.  Patient states she would like to come in and have her CBC and B12 checked at the same time.  I let her know that I will send a note back to find out when we will need to schedule in order to check them both in the same visit and we'll get back with her to schedule.

## 2023-05-25 NOTE — Telephone Encounter (Signed)
See me before calling. The labs are ok.  The MCV - (just .4 above normal range) - we follow.  She does have a history of B12 deficiency.  Inform her that the B12 was not run.  I would like to check the B12 if she can come in for an appt.  Pending when she can come in, will recheck cbc and b12.  Sorry for the inconvenience.

## 2023-05-26 NOTE — Telephone Encounter (Signed)
Ok to check within the next 2 weeks.

## 2023-05-26 NOTE — Telephone Encounter (Signed)
When do you want her to have cbc and b12 checked?

## 2023-06-10 ENCOUNTER — Ambulatory Visit
Admission: RE | Admit: 2023-06-10 | Discharge: 2023-06-10 | Disposition: A | Discharge status: Home / Self Care | Payer: Commercial Managed Care - PPO | Source: Ambulatory Visit | Attending: Internal Medicine | Admitting: Internal Medicine

## 2023-06-10 DIAGNOSIS — Z1231 Encounter for screening mammogram for malignant neoplasm of breast: Secondary | ICD-10-CM | POA: Diagnosis present

## 2024-04-18 ENCOUNTER — Ambulatory Visit (INDEPENDENT_AMBULATORY_CARE_PROVIDER_SITE_OTHER): Payer: Self-pay

## 2024-04-18 DIAGNOSIS — D128 Benign neoplasm of rectum: Secondary | ICD-10-CM | POA: Diagnosis not present

## 2024-04-18 DIAGNOSIS — Z1211 Encounter for screening for malignant neoplasm of colon: Secondary | ICD-10-CM | POA: Diagnosis present

## 2024-04-18 DIAGNOSIS — K5730 Diverticulosis of large intestine without perforation or abscess without bleeding: Secondary | ICD-10-CM | POA: Diagnosis not present

## 2024-04-18 DIAGNOSIS — K640 First degree hemorrhoids: Secondary | ICD-10-CM | POA: Diagnosis not present

## 2024-04-18 DIAGNOSIS — Z860101 Personal history of adenomatous and serrated colon polyps: Secondary | ICD-10-CM | POA: Diagnosis not present

## 2024-05-17 ENCOUNTER — Encounter: Payer: Self-pay | Admitting: Internal Medicine

## 2024-05-17 DIAGNOSIS — E7800 Pure hypercholesterolemia, unspecified: Secondary | ICD-10-CM

## 2024-05-17 DIAGNOSIS — E538 Deficiency of other specified B group vitamins: Secondary | ICD-10-CM

## 2024-05-17 DIAGNOSIS — E559 Vitamin D deficiency, unspecified: Secondary | ICD-10-CM

## 2024-05-19 ENCOUNTER — Other Ambulatory Visit (INDEPENDENT_AMBULATORY_CARE_PROVIDER_SITE_OTHER)

## 2024-05-19 DIAGNOSIS — E7800 Pure hypercholesterolemia, unspecified: Secondary | ICD-10-CM

## 2024-05-19 DIAGNOSIS — E559 Vitamin D deficiency, unspecified: Secondary | ICD-10-CM | POA: Diagnosis not present

## 2024-05-19 DIAGNOSIS — E538 Deficiency of other specified B group vitamins: Secondary | ICD-10-CM

## 2024-05-19 LAB — BASIC METABOLIC PANEL WITH GFR
BUN: 15 mg/dL (ref 6–23)
CO2: 29 meq/L (ref 19–32)
Calcium: 9.1 mg/dL (ref 8.4–10.5)
Chloride: 102 meq/L (ref 96–112)
Creatinine, Ser: 0.55 mg/dL (ref 0.40–1.20)
GFR: 97.04 mL/min (ref >60.00)
Glucose, Bld: 93 mg/dL (ref 70–99)
Potassium: 4.3 meq/L (ref 3.5–5.1)
Sodium: 142 meq/L (ref 135–145)

## 2024-05-19 LAB — CBC WITH DIFFERENTIAL/PLATELET
Basophils Absolute: 0 K/uL (ref 0.0–0.1)
Basophils Relative: 0.8 % (ref 0.0–3.0)
Eosinophils Absolute: 0.1 K/uL (ref 0.0–0.7)
Eosinophils Relative: 1.3 % (ref 0.0–5.0)
HCT: 40.6 % (ref 36.0–46.0)
Hemoglobin: 13.3 g/dL (ref 12.0–15.0)
Lymphocytes Relative: 29.6 % (ref 12.0–46.0)
Lymphs Abs: 1.7 K/uL (ref 0.7–4.0)
MCHC: 32.7 g/dL (ref 30.0–36.0)
MCV: 99.3 fl (ref 78.0–100.0)
Monocytes Absolute: 0.4 K/uL (ref 0.1–1.0)
Monocytes Relative: 7.3 % (ref 3.0–12.0)
Neutro Abs: 3.6 K/uL (ref 1.4–7.7)
Neutrophils Relative %: 61 % (ref 43.0–77.0)
Platelets: 215 K/uL (ref 150.0–400.0)
RBC: 4.09 Mil/uL (ref 3.87–5.11)
RDW: 12.3 % (ref 11.5–15.5)
WBC: 5.9 K/uL (ref 4.0–10.5)

## 2024-05-19 LAB — HEPATIC FUNCTION PANEL
ALT: 15 U/L (ref 0–35)
AST: 19 U/L (ref 0–37)
Albumin: 4.5 g/dL (ref 3.5–5.2)
Alkaline Phosphatase: 57 U/L (ref 39–117)
Bilirubin, Direct: 0.1 mg/dL (ref 0.0–0.3)
Total Bilirubin: 0.6 mg/dL (ref 0.2–1.2)
Total Protein: 7.4 g/dL (ref 6.0–8.3)

## 2024-05-19 LAB — VITAMIN D 25 HYDROXY (VIT D DEFICIENCY, FRACTURES): VITD: 21.87 ng/mL — ABNORMAL LOW (ref 30.00–100.00)

## 2024-05-19 LAB — LIPID PANEL
Cholesterol: 240 mg/dL — ABNORMAL HIGH (ref 0–200)
HDL: 72.4 mg/dL (ref >39.00)
LDL Cholesterol: 152 mg/dL — ABNORMAL HIGH (ref 0–99)
NonHDL: 167.6
Total CHOL/HDL Ratio: 3
Triglycerides: 79 mg/dL (ref 0.0–149.0)
VLDL: 15.8 mg/dL (ref 0.0–40.0)

## 2024-05-19 LAB — TSH: TSH: 1.09 u[IU]/mL (ref 0.35–5.50)

## 2024-05-19 LAB — VITAMIN B12: Vitamin B-12: 221 pg/mL (ref 211–911)

## 2024-05-20 ENCOUNTER — Ambulatory Visit: Payer: Self-pay | Admitting: Internal Medicine

## 2024-05-24 ENCOUNTER — Ambulatory Visit: Payer: Commercial Managed Care - PPO | Admitting: Internal Medicine

## 2024-05-24 ENCOUNTER — Other Ambulatory Visit (HOSPITAL_COMMUNITY)
Admission: RE | Admit: 2024-05-24 | Discharge: 2024-05-24 | Disposition: A | Discharge status: Home / Self Care | Source: Ambulatory Visit | Attending: Internal Medicine | Admitting: Internal Medicine

## 2024-05-24 VITALS — BP 118/70 | HR 67 | Resp 16 | Ht 67.5 in | Wt 163.4 lb

## 2024-05-24 DIAGNOSIS — E559 Vitamin D deficiency, unspecified: Secondary | ICD-10-CM | POA: Diagnosis not present

## 2024-05-24 DIAGNOSIS — Z1231 Encounter for screening mammogram for malignant neoplasm of breast: Secondary | ICD-10-CM

## 2024-05-24 DIAGNOSIS — E538 Deficiency of other specified B group vitamins: Secondary | ICD-10-CM

## 2024-05-24 DIAGNOSIS — Z0000 Encounter for general adult medical examination without abnormal findings: Secondary | ICD-10-CM | POA: Diagnosis not present

## 2024-05-24 DIAGNOSIS — Z124 Encounter for screening for malignant neoplasm of cervix: Secondary | ICD-10-CM | POA: Diagnosis present

## 2024-05-24 DIAGNOSIS — Z136 Encounter for screening for cardiovascular disorders: Secondary | ICD-10-CM

## 2024-05-24 DIAGNOSIS — E7800 Pure hypercholesterolemia, unspecified: Secondary | ICD-10-CM

## 2024-05-24 MED ORDER — SYRINGE 25G X 1" 3 ML MISC: 0 refills | Status: DC

## 2024-05-24 MED ORDER — VITAMIN D (ERGOCALCIFEROL) 1.25 MG (50000 UNIT) PO CAPS: 50000.0000 [IU] | ORAL_CAPSULE | ORAL | 0 refills | Status: AC

## 2024-05-24 MED ORDER — CYANOCOBALAMIN 1000 MCG/ML IJ SOLN: INTRAMUSCULAR | 0 refills | Status: AC

## 2024-05-24 NOTE — Assessment & Plan Note (Addendum)
 The 10-year ASCVD risk score (Arnett DK, et al., 2019) is: 7.9%   Values used to calculate the score:     Age: 63 years     Clincally relevant sex: Female     Is Non-Hispanic African American: No     Diabetic: No     Tobacco smoker: Yes     Systolic Blood Pressure: 118 mmHg     Is BP treated: No     HDL Cholesterol: 72.4 mg/dL     Total Cholesterol: 240 mg/dL  Discussed calcium score - screening CAD. Discussed cholesterol treatment. Continue diet and exercise.

## 2024-05-24 NOTE — Assessment & Plan Note (Addendum)
 Physical today 05/24/24.  PAP 06/11/21 - negative with negative HPV.  Repoeat pap today. Mammogram 06/10/23 - Birads I.  Schedule f/u mammogram. Colonoscopy 12/2017.  Recommended f/u in 5 years. Saw GI. Recommended colonoscopy.

## 2024-05-24 NOTE — Progress Notes (Signed)
 Subjective:    Patient ID: Peggy Ward, female    DOB: 02/06/1961, 64 y.o.   MRN: [id number]  Patient here for  Chief Complaint  Patient presents with   Annual Exam    HPI Here for physical exam. Recently saw GI - recommended colonoscopy. Tries to stay active. No chest pain or sob reported. No abdominal pain or bowel change reported. Does report minimal headache - intermittent - recent. (Two days). Notify if persistent. No nausea or vomiting. No diarrhea. Discussed screening calcium score.    Past Medical History:  Diagnosis Date   GERD (gastroesophageal reflux disease)    Hypercholesterolemia    Vitamin D  deficiency    Past Surgical History:  Procedure Laterality Date   BREAST EXCISIONAL BIOPSY Right 1998   neg   Family History  Problem Relation Age of Onset   Cancer Father        prostate   Depression Father    Heart disease Father        MI, stent   Mental retardation Father    Hyperlipidemia Mother    Cancer Paternal Grandfather        prostate   Breast cancer Neg Hx    Social History   Socioeconomic History   Marital status: Widowed    Spouse name: Not on file   Number of children: 2   Years of education: Not on file   Highest education level: Bachelor's degree (e.g., BA, AB, BS)  Occupational History    Employer: [employer]  Tobacco Use   Smoking status: Former    Current packs/day: 0.00    Types: Cigarettes    Quit date: 03/2019    Years since quitting: 5.2   Smokeless tobacco: Never  Substance and Sexual Activity   Alcohol use: Yes    Alcohol/week: 0.0 standard drinks of alcohol    Comment: 3-5 glasses of wine per day   Drug use: No   Sexual activity: Not on file  Other Topics Concern   Not on file  Social History Narrative   Not on file   Social Drivers of Health   Financial Resource Strain: Low Risk  (05/22/2024)   Overall Financial Resource Strain (CARDIA)    Difficulty of Paying Living Expenses: Not hard at all  Food Insecurity: No  Food Insecurity (05/22/2024)   Hunger Vital Sign    Worried About Running Out of Food in the Last Year: Never true    Ran Out of Food in the Last Year: Never true  Transportation Needs: No Transportation Needs (05/22/2024)   PRAPARE - Administrator, Civil Service (Medical): No    Lack of Transportation (Non-Medical): No  Physical Activity: Inactive (05/22/2024)   Exercise Vital Sign    Days of Exercise per Week: 0 days    Minutes of Exercise per Session: Not on file  Stress: No Stress Concern Present (05/22/2024)   [employer] of Occupational Health - Occupational Stress Questionnaire    Feeling of Stress: Only a little  Social Connections: Moderately Integrated (05/22/2024)   Social Connection and Isolation Panel    Frequency of Communication with Friends and Family: More than three times a week    Frequency of Social Gatherings with Friends and Family: More than three times a week    Attends Religious Services: More than 4 times per year    Active Member of [employer] or Organizations: Yes    Attends Banker Meetings: More than 4 times per  year    Marital Status: Widowed     Review of Systems  Constitutional:  Negative for appetite change and unexpected weight change.  HENT:  Negative for congestion, sinus pressure and sore throat.   Eyes:  Negative for pain and visual disturbance.  Respiratory:  Negative for cough, chest tightness and shortness of breath.   Cardiovascular:  Negative for chest pain, palpitations and leg swelling.  Gastrointestinal:  Negative for abdominal pain, diarrhea, nausea and vomiting.  Genitourinary:  Negative for difficulty urinating and dysuria.  Musculoskeletal:  Negative for joint swelling and myalgias.  Skin:  Negative for color change and rash.  Neurological:  Negative for dizziness.       Minimal headache.   Hematological:  Negative for adenopathy. Does not bruise/bleed easily.  Psychiatric/Behavioral:  Negative for agitation and  dysphoric mood.        Objective:     BP 118/70   Pulse 67   Resp 16   Ht 5' 7.5 (1.715 m)   Wt 163 lb 6.4 oz (74.1 kg)   LMP 03/21/2011   SpO2 98%   BMI 25.21 kg/m  Wt Readings from Last 3 Encounters:  05/24/24 163 lb 6.4 oz (74.1 kg)  05/22/23 165 lb 3.2 oz (74.9 kg)  06/26/22 171 lb 11.2 oz (77.9 kg)    Physical Exam Vitals reviewed.  Constitutional:      General: She is not in acute distress.    Appearance: Normal appearance. She is well-developed.  HENT:     Head: Normocephalic and atraumatic.     Right Ear: External ear normal.     Left Ear: External ear normal.     Mouth/Throat:     Pharynx: No oropharyngeal exudate or posterior oropharyngeal erythema.  Eyes:     General: No scleral icterus.       Right eye: No discharge.        Left eye: No discharge.     Conjunctiva/sclera: Conjunctivae normal.  Neck:     Thyroid : No thyromegaly.  Cardiovascular:     Rate and Rhythm: Normal rate and regular rhythm.  Pulmonary:     Effort: No tachypnea, accessory muscle usage or respiratory distress.     Breath sounds: Normal breath sounds. No decreased breath sounds or wheezing.  Chest:  Breasts:    Right: No inverted nipple, mass, nipple discharge or tenderness (no axillary adenopathy).     Left: No inverted nipple, mass, nipple discharge or tenderness (no axilarry adenopathy).  Abdominal:     General: Bowel sounds are normal.     Palpations: Abdomen is soft.     Tenderness: There is no abdominal tenderness.  Genitourinary:    Comments: Normal external genitalia.  Vaginal vault without lesions.  Cervix identified.  Pap smear performed.  Could not appreciate any adnexal masses or tenderness.   Musculoskeletal:        General: No swelling or tenderness.     Cervical back: Neck supple.  Lymphadenopathy:     Cervical: No cervical adenopathy.  Skin:    Findings: No erythema or rash.  Neurological:     Mental Status: She is alert and oriented to person, place, and  time.  Psychiatric:        Mood and Affect: Mood normal.        Behavior: Behavior normal.         Outpatient Encounter Medications as of 05/24/2024  Medication Sig   cyanocobalamin  (VITAMIN B12) 1000 MCG/ML injection 1000 mcg (1 mL)  intramuscular injection in the thigh ( vastus lateralis) once per week for four weeks, followed by 1000 mcg IM injection once per month.   famciclovir  (FAMVIR ) 500 MG tablet Take 1 tablet (500 mg total) by mouth 2 (two) times daily as needed.   Syringe/Needle, Disp, (SYRINGE 3CC/25GX1) 25G X 1 3 ML MISC Use to give IM B12 injection   [DISCONTINUED] cyanocobalamin  (VITAMIN B12) 1000 MCG/ML injection Inject 1,000 mg (1mL) once weekly for four week, then inject 1ML once monthly thereafter.   [DISCONTINUED] pantoprazole  (PROTONIX ) 20 MG tablet TAKE 1 TABLET (20 MG TOTAL) BY MOUTH DAILY.   Vitamin D , Ergocalciferol , (DRISDOL ) 1.25 MG (50000 UNIT) CAPS capsule Take 1 capsule (50,000 Units total) by mouth every 7 (seven) days.   [DISCONTINUED] Cyanocobalamin  (B-12) 5000 MCG SUBL Place 1 tablet under the tongue daily.   [DISCONTINUED] estradiol  (ESTRACE  VAGINAL) 0.1 MG/GM vaginal cream Place 1 Applicatorful vaginally at bedtime for 14 days. Daily x 14 days,  then twice a week   [DISCONTINUED] Vitamin D , Ergocalciferol , (DRISDOL ) 1.25 MG (50000 UNIT) CAPS capsule Take 1 capsule (50,000 Units total) by mouth every 7 (seven) days.   No facility-administered encounter medications on file as of 05/24/2024.     Lab Results  Component Value Date   WBC 5.9 05/19/2024   HGB 13.3 05/19/2024   HCT 40.6 05/19/2024   PLT 215.0 05/19/2024   GLUCOSE 93 05/19/2024   CHOL 240 (H) 05/19/2024   TRIG 79.0 05/19/2024   HDL 72.40 05/19/2024   LDLDIRECT 137.0 05/27/2022   LDLCALC 152 (H) 05/19/2024   ALT 15 05/19/2024   AST 19 05/19/2024   NA 142 05/19/2024   K 4.3 05/19/2024   CL 102 05/19/2024   CREATININE 0.55 05/19/2024   BUN 15 05/19/2024   CO2 29 05/19/2024   TSH 1.09  05/19/2024    MM 3D SCREENING MAMMOGRAM BILATERAL BREAST Result Date: 06/11/2023 CLINICAL DATA:  Screening. EXAM: DIGITAL SCREENING BILATERAL MAMMOGRAM WITH TOMOSYNTHESIS AND CAD TECHNIQUE: Bilateral screening digital craniocaudal and mediolateral oblique mammograms were obtained. Bilateral screening digital breast tomosynthesis was performed. The images were evaluated with computer-aided detection. COMPARISON:  Previous exam(s). ACR Breast Density Category b: There are scattered areas of fibroglandular density. FINDINGS: There are no findings suspicious for malignancy. IMPRESSION: No mammographic evidence of malignancy. A result letter of this screening mammogram will be mailed directly to the patient. RECOMMENDATION: Screening mammogram in one year. (Code:SM-B-01Y) BI-RADS CATEGORY  1: Negative. Electronically Signed   By: Inocente Ast M.D.   On: 06/11/2023 13:40       Assessment & Plan:  Routine general medical examination at a health care facility  Hypercholesterolemia Assessment & Plan: The 10-year ASCVD risk score (Arnett DK, et al., 2019) is: 7.9%   Values used to calculate the score:     Age: 80 years     Clincally relevant sex: Female     Is Non-Hispanic African American: No     Diabetic: No     Tobacco smoker: Yes     Systolic Blood Pressure: 118 mmHg     Is BP treated: No     HDL Cholesterol: 72.4 mg/dL     Total Cholesterol: 240 mg/dL  Discussed calcium score - screening CAD. Discussed cholesterol treatment. Continue diet and exercise.   Orders: -     Lipid panel; Future -     Hepatic function panel; Future -     Basic metabolic panel with GFR; Future -     TSH; Future -  CBC with Differential/Platelet; Future  Health care maintenance Assessment & Plan: Physical today 05/24/24.  PAP 06/11/21 - negative with negative HPV.  Repoeat pap today. Mammogram 06/10/23 - Birads I.  Schedule f/u mammogram. Colonoscopy 12/2017.  Recommended f/u in 5 years. Saw GI. Recommended  colonoscopy.    Encounter for screening mammogram for malignant neoplasm of breast -     3D Screening Mammogram, Left and Right; Future  Vitamin D  deficiency Assessment & Plan: Check vitamin d  level.   Orders: -     VITAMIN D  25 Hydroxy (Vit-D Deficiency, Fractures); Future  B12 deficiency Assessment & Plan: B12 injections.   Orders: -     Vitamin B12; Future  Screening for cervical cancer -     Cytology - PAP  Encounter for screening for coronary artery disease -     CT CARDIAC SCORING (SELF PAY ONLY); Future  Other orders -     Cyanocobalamin ; 1000 mcg (1 mL) intramuscular injection in the thigh ( vastus lateralis) once per week for four weeks, followed by 1000 mcg IM injection once per month.  Dispense: 10 mL; Refill: 0 -     Syringe; Use to give IM B12 injection  Dispense: 16 each; Refill: 0 -     Vitamin D  (Ergocalciferol ); Take 1 capsule (50,000 Units total) by mouth every 7 (seven) days.  Dispense: 12 capsule; Refill: 0     Allena Hamilton, MD

## 2024-05-26 LAB — CYTOLOGY - PAP
Comment: NEGATIVE
Diagnosis: NEGATIVE
Diagnosis: REACTIVE
High risk HPV: NEGATIVE

## 2024-05-27 ENCOUNTER — Ambulatory Visit: Payer: Self-pay | Admitting: Internal Medicine

## 2024-05-30 ENCOUNTER — Encounter: Payer: Self-pay | Admitting: Internal Medicine

## 2024-05-30 NOTE — Assessment & Plan Note (Signed)
B12 injections

## 2024-05-30 NOTE — Assessment & Plan Note (Signed)
Check vitamin d level

## 2024-06-03 ENCOUNTER — Ambulatory Visit
Admission: RE | Admit: 2024-06-03 | Discharge: 2024-06-03 | Disposition: A | Discharge status: Home / Self Care | Payer: Self-pay | Source: Ambulatory Visit | Attending: Internal Medicine | Admitting: Internal Medicine

## 2024-06-03 DIAGNOSIS — Z136 Encounter for screening for cardiovascular disorders: Secondary | ICD-10-CM | POA: Insufficient documentation

## 2024-06-10 ENCOUNTER — Telehealth: Payer: Self-pay

## 2024-06-10 ENCOUNTER — Encounter: Payer: Self-pay | Admitting: Internal Medicine

## 2024-06-10 NOTE — Telephone Encounter (Signed)
 Copied from CRM [id number]. Topic: Clinical - Lab/Test Results >> Jun 10, 2024 12:09 PM Dedra B wrote: Reason for CRM: Pt returning call regarding imaging results

## 2024-06-10 NOTE — Telephone Encounter (Signed)
 Pt was notified under her result note management tab. Please see the result note encounter.

## 2024-06-11 ENCOUNTER — Other Ambulatory Visit: Payer: Self-pay | Admitting: Internal Medicine

## 2024-06-11 DIAGNOSIS — E7800 Pure hypercholesterolemia, unspecified: Secondary | ICD-10-CM

## 2024-06-11 MED ORDER — ROSUVASTATIN CALCIUM 10 MG PO TABS: 10.0000 mg | ORAL_TABLET | Freq: Every day | ORAL | 2 refills | Status: DC

## 2024-06-11 NOTE — Progress Notes (Signed)
Order placed for f/u lab.   

## 2024-06-14 ENCOUNTER — Encounter

## 2024-06-15 NOTE — Telephone Encounter (Signed)
 Tried to call. Left message. Calciium score - screening for plaque in the heart arteries. Given she does have evidence of plaque in the artery, it is recommended to start a cholesterol medication and aspirin.

## 2024-06-16 NOTE — Telephone Encounter (Signed)
 Please call her and let her know - one artery had some plaque present. The reason for recommending the cholesterol medication is to help prevent further plaque

## 2024-06-16 NOTE — Telephone Encounter (Signed)
 LM for patient. Please relay information below. If further questions, can set up an appointment with Dr Glendia to discuss the scan in more detail.

## 2024-06-17 ENCOUNTER — Ambulatory Visit
Admission: RE | Admit: 2024-06-17 | Discharge: 2024-06-17 | Disposition: A | Discharge status: Home / Self Care | Source: Ambulatory Visit | Attending: Internal Medicine | Admitting: Internal Medicine

## 2024-06-17 DIAGNOSIS — Z1231 Encounter for screening mammogram for malignant neoplasm of breast: Secondary | ICD-10-CM | POA: Insufficient documentation

## 2024-06-17 NOTE — Telephone Encounter (Signed)
 Can discuss - hold for appt as we discussed.

## 2024-06-17 NOTE — Telephone Encounter (Signed)
 Patient returned call from Sueanne Shallow, LPN.  See patient message from 06/10/2024 (I was unable to edit/addend).  I read message from Dr. Allena Hamilton to patient.  Patient states she would like to know the full range being used for calcium scores so she will better understand her calcium score of 150.  Patient states she is fine with us  sending this information to her via MyChart.

## 2024-06-17 NOTE — Telephone Encounter (Signed)
 See other note

## 2024-06-17 NOTE — Telephone Encounter (Signed)
 Patient was notified and made aware of Dr Freda recommendations. Verbalized understanding patient says her question is, is there a range for the Calcium Score. Please advise?

## 2024-06-18 NOTE — Telephone Encounter (Signed)
 this is a printed guideline.  A CAC score of 0 indicates no calcified plaque.  Scores from 1 to 10 indicate minimal plaque.  Scores from 11 to 100 indicate mild plaque.  Scores from 101 to 400 indicate moderate plaque.  Scores greater than 400 indicate severe plaque.  Scores of 1000 or greater indicate extreme plaque. Again, she has some plaque in one artery. Recommendation to start statin as outlined.  As we discussed can schedule appt if persistent concerns or questions.

## 2024-06-22 ENCOUNTER — Other Ambulatory Visit: Payer: Self-pay | Admitting: Internal Medicine

## 2024-06-22 DIAGNOSIS — R928 Other abnormal and inconclusive findings on diagnostic imaging of breast: Secondary | ICD-10-CM

## 2024-06-23 NOTE — Telephone Encounter (Unsigned)
 Copied from CRM [id number]. Topic: General - Other >> Jun 23, 2024  4:13 PM Drema MATSU wrote: Reason for CRM: Patient is returning a call from [place]. Please return patient call.

## 2024-06-24 ENCOUNTER — Ambulatory Visit
Admission: RE | Admit: 2024-06-24 | Discharge: 2024-06-24 | Disposition: A | Discharge status: Home / Self Care | Source: Ambulatory Visit | Attending: Internal Medicine | Admitting: Internal Medicine

## 2024-06-24 DIAGNOSIS — R928 Other abnormal and inconclusive findings on diagnostic imaging of breast: Secondary | ICD-10-CM | POA: Insufficient documentation

## 2024-06-24 NOTE — Telephone Encounter (Signed)
 Pt stated that she is aware of the information below and has already picked up the medication. Pt also stated that she wanted to cancel her virtual visit with Dr. Glendia on 10 /16 and just keep her original follow up scheduled for 07/19/2024. Appt canceled.

## 2024-06-27 ENCOUNTER — Ambulatory Visit: Payer: Self-pay | Admitting: Internal Medicine

## 2024-06-30 ENCOUNTER — Ambulatory Visit: Admitting: Internal Medicine

## 2024-07-19 ENCOUNTER — Ambulatory Visit: Admitting: Internal Medicine

## 2024-07-19 VITALS — BP 100/60 | HR 85 | Temp 98.0°F | Resp 17 | Ht 67.5 in | Wt 165.8 lb

## 2024-07-19 DIAGNOSIS — F439 Reaction to severe stress, unspecified: Secondary | ICD-10-CM

## 2024-07-19 DIAGNOSIS — E7800 Pure hypercholesterolemia, unspecified: Secondary | ICD-10-CM

## 2024-07-19 DIAGNOSIS — Z1231 Encounter for screening mammogram for malignant neoplasm of breast: Secondary | ICD-10-CM | POA: Diagnosis not present

## 2024-07-19 NOTE — Progress Notes (Signed)
 Subjective:    Patient ID: Peggy Ward, female    DOB: [birth year], 63 y.o.   MRN: [id number]  Patient here for a scheduled follow up   HPI Here for a scheduled follow up - follow up regarding hypercholesterolemia. Recent calcium score - 06/03/24 - 150 - LAD. Recommended aspirin and statin.  Discussed today. Taking and tolerating crestor. No chest pain or sob reported. No abdominal pain or bowel change reported.    Past Medical History:  Diagnosis Date   GERD (gastroesophageal reflux disease)    Hypercholesterolemia    Vitamin D  deficiency    Past Surgical History:  Procedure Laterality Date   BREAST EXCISIONAL BIOPSY Right 1998   neg   Family History  Problem Relation Age of Onset   Cancer Father        prostate   Depression Father    Heart disease Father        MI, stent   Mental retardation Father    Hyperlipidemia Mother    Cancer Paternal Grandfather        prostate   Breast cancer Neg Hx    Social History   Socioeconomic History   Marital status: Widowed    Spouse name: Not on file   Number of children: 2   Years of education: Not on file   Highest education level: Bachelor's degree (e.g., BA, AB, BS)  Occupational History    Employer: [employer]  Tobacco Use   Smoking status: Former    Current packs/day: 0.00    Types: Cigarettes    Quit date: 03/2019    Years since quitting: 5.3   Smokeless tobacco: Never  Substance and Sexual Activity   Alcohol use: Yes    Alcohol/week: 0.0 standard drinks of alcohol    Comment: 3-5 glasses of wine per day   Drug use: No   Sexual activity: Not on file  Other Topics Concern   Not on file  Social History Narrative   Not on file   Social Drivers of Health   Financial Resource Strain: Low Risk  (05/22/2024)   Overall Financial Resource Strain (CARDIA)    Difficulty of Paying Living Expenses: Not hard at all  Food Insecurity: No Food Insecurity (05/22/2024)   Hunger Vital Sign    Worried About Running Out of Food in  the Last Year: Never true    Ran Out of Food in the Last Year: Never true  Transportation Needs: No Transportation Needs (05/22/2024)   PRAPARE - Administrator, Civil Service (Medical): No    Lack of Transportation (Non-Medical): No  Physical Activity: Inactive (05/22/2024)   Exercise Vital Sign    Days of Exercise per Week: 0 days    Minutes of Exercise per Session: Not on file  Stress: No Stress Concern Present (05/22/2024)   [employer] of Occupational Health - Occupational Stress Questionnaire    Feeling of Stress: Only a little  Social Connections: Moderately Integrated (05/22/2024)   Social Connection and Isolation Panel    Frequency of Communication with Friends and Family: More than three times a week    Frequency of Social Gatherings with Friends and Family: More than three times a week    Attends Religious Services: More than 4 times per year    Active Member of [employer] or Organizations: Yes    Attends Banker Meetings: More than 4 times per year    Marital Status: Widowed     Review of Systems  Constitutional:  Negative for appetite change and unexpected weight change.  HENT:  Negative for congestion and sinus pressure.   Respiratory:  Negative for cough, chest tightness and shortness of breath.   Cardiovascular:  Negative for chest pain, palpitations and leg swelling.  Gastrointestinal:  Negative for abdominal pain, diarrhea, nausea and vomiting.  Genitourinary:  Negative for difficulty urinating and dysuria.  Musculoskeletal:  Negative for joint swelling and myalgias.  Skin:  Negative for color change and rash.  Neurological:  Negative for dizziness and headaches.  Psychiatric/Behavioral:  Negative for agitation and dysphoric mood.        Objective:     BP 100/60   Pulse 85   Temp 98 F (36.7 C) (Oral)   Resp 17   Ht 5' 7.5 (1.715 m)   Wt 165 lb 12 oz (75.2 kg)   LMP 03/21/2011   SpO2 99%   BMI 25.58 kg/m  Wt Readings from Last 3  Encounters:  07/19/24 165 lb 12 oz (75.2 kg)  05/24/24 163 lb 6.4 oz (74.1 kg)  05/22/23 165 lb 3.2 oz (74.9 kg)    Physical Exam Vitals reviewed.  Constitutional:      General: She is not in acute distress.    Appearance: Normal appearance.  HENT:     Head: Normocephalic and atraumatic.     Right Ear: External ear normal.     Left Ear: External ear normal.     Mouth/Throat:     Pharynx: No oropharyngeal exudate or posterior oropharyngeal erythema.  Eyes:     General: No scleral icterus.       Right eye: No discharge.        Left eye: No discharge.     Conjunctiva/sclera: Conjunctivae normal.  Neck:     Thyroid : No thyromegaly.  Cardiovascular:     Rate and Rhythm: Normal rate and regular rhythm.  Pulmonary:     Effort: No respiratory distress.     Breath sounds: Normal breath sounds. No wheezing.  Abdominal:     General: Bowel sounds are normal.     Palpations: Abdomen is soft.     Tenderness: There is no abdominal tenderness.  Musculoskeletal:        General: No swelling or tenderness.     Cervical back: Neck supple. No tenderness.  Lymphadenopathy:     Cervical: No cervical adenopathy.  Skin:    Findings: No erythema or rash.  Neurological:     Mental Status: She is alert.  Psychiatric:        Mood and Affect: Mood normal.        Behavior: Behavior normal.         Outpatient Encounter Medications as of 07/19/2024  Medication Sig   aspirin EC 81 MG tablet Take 81 mg by mouth daily. Swallow whole.   cyanocobalamin  (VITAMIN B12) 1000 MCG/ML injection 1000 mcg (1 mL) intramuscular injection in the thigh ( vastus lateralis) once per week for four weeks, followed by 1000 mcg IM injection once per month.   famciclovir  (FAMVIR ) 500 MG tablet Take 1 tablet (500 mg total) by mouth 2 (two) times daily as needed.   rosuvastatin (CRESTOR) 10 MG tablet Take 1 tablet (10 mg total) by mouth daily.   Vitamin D , Ergocalciferol , (DRISDOL ) 1.25 MG (50000 UNIT) CAPS capsule Take  1 capsule (50,000 Units total) by mouth every 7 (seven) days.   [DISCONTINUED] Syringe/Needle, Disp, (SYRINGE 3CC/25GX1) 25G X 1 3 ML MISC Use to give IM B12 injection  No facility-administered encounter medications on file as of 07/19/2024.     Lab Results  Component Value Date   WBC 5.9 05/19/2024   HGB 13.3 05/19/2024   HCT 40.6 05/19/2024   PLT 215.0 05/19/2024   GLUCOSE 93 05/19/2024   CHOL 240 (H) 05/19/2024   TRIG 79.0 05/19/2024   HDL 72.40 05/19/2024   LDLDIRECT 137.0 05/27/2022   LDLCALC 152 (H) 05/19/2024   ALT 18 07/19/2024   AST 22 07/19/2024   NA 142 05/19/2024   K 4.3 05/19/2024   CL 102 05/19/2024   CREATININE 0.55 05/19/2024   BUN 15 05/19/2024   CO2 29 05/19/2024   TSH 1.09 05/19/2024    MM 3D DIAGNOSTIC MAMMOGRAM UNILATERAL LEFT BREAST Result Date: 06/24/2024 CLINICAL DATA:  LEFT asymmetry callback EXAM: DIGITAL DIAGNOSTIC UNILATERAL LEFT MAMMOGRAM WITH TOMOSYNTHESIS AND CAD; ULTRASOUND LEFT BREAST LIMITED TECHNIQUE: Left digital diagnostic mammography and breast tomosynthesis was performed. The images were evaluated with computer-aided detection. ; Targeted ultrasound examination of the left breast was performed. COMPARISON:  Previous exam(s). ACR Breast Density Category c: The breasts are heterogeneously dense, which may obscure small masses. FINDINGS: The previously described findings do not persist with additional views, consistent with superimposed fibroglandular tissue. No suspicious mass, microcalcification, or other finding is identified. Due to breast density, targeted ultrasound was performed. Targeted ultrasound was performed of the LEFT outer breast. No suspicious cystic or solid mass is seen at the site of screening mammographic concern. IMPRESSION: No mammographic or sonographic evidence of malignancy at the site of screening mammographic concern. RECOMMENDATION: Screening mammogram in one year.(Code:SM-B-01Y) Patient has dense breast tissue.  Supplemental screening with breast ultrasound (where available) or breast MRI with and without contrast/abbreviated breast MRI could be considered as clinically warranted. I have discussed the findings and recommendations with the patient. If applicable, a reminder letter will be sent to the patient regarding the next appointment. BI-RADS CATEGORY  1: Negative. Electronically Signed   By: Corean Salter M.D.   On: 06/24/2024 16:25   US  LIMITED ULTRASOUND INCLUDING AXILLA LEFT BREAST  Result Date: 06/24/2024 CLINICAL DATA:  LEFT asymmetry callback EXAM: DIGITAL DIAGNOSTIC UNILATERAL LEFT MAMMOGRAM WITH TOMOSYNTHESIS AND CAD; ULTRASOUND LEFT BREAST LIMITED TECHNIQUE: Left digital diagnostic mammography and breast tomosynthesis was performed. The images were evaluated with computer-aided detection. ; Targeted ultrasound examination of the left breast was performed. COMPARISON:  Previous exam(s). ACR Breast Density Category c: The breasts are heterogeneously dense, which may obscure small masses. FINDINGS: The previously described findings do not persist with additional views, consistent with superimposed fibroglandular tissue. No suspicious mass, microcalcification, or other finding is identified. Due to breast density, targeted ultrasound was performed. Targeted ultrasound was performed of the LEFT outer breast. No suspicious cystic or solid mass is seen at the site of screening mammographic concern. IMPRESSION: No mammographic or sonographic evidence of malignancy at the site of screening mammographic concern. RECOMMENDATION: Screening mammogram in one year.(Code:SM-B-01Y) Patient has dense breast tissue. Supplemental screening with breast ultrasound (where available) or breast MRI with and without contrast/abbreviated breast MRI could be considered as clinically warranted. I have discussed the findings and recommendations with the patient. If applicable, a reminder letter will be sent to the patient  regarding the next appointment. BI-RADS CATEGORY  1: Negative. Electronically Signed   By: Corean Salter M.D.   On: 06/24/2024 16:25       Assessment & Plan:  Stress Assessment & Plan: Overall appears to be handling things relatively well.  Follow.  Encounter for screening mammogram for malignant neoplasm of breast  Hypercholesteremia  Hypercholesterolemia Assessment & Plan: The 10-year ASCVD risk score (Arnett DK, et al., 2019) is: 3%   Values used to calculate the score:     Age: 54 years     Clincally relevant sex: Female     Is Non-Hispanic African American: No     Diabetic: No     Tobacco smoker: No     Systolic Blood Pressure: 100 mmHg     Is BP treated: No     HDL Cholesterol: 72.4 mg/dL     Total Cholesterol: 240 mg/dL  Discussed calcium score - screening CAD. Continue diet and exercise. Tolerating crestor. Follow lipid panel. Check liver panel today.   Orders: -     Hepatic function panel     Allena Hamilton, MD

## 2024-07-20 LAB — HEPATIC FUNCTION PANEL
ALT: 18 U/L (ref 0–35)
AST: 22 U/L (ref 0–37)
Albumin: 4.5 g/dL (ref 3.5–5.2)
Alkaline Phosphatase: 57 U/L (ref 39–117)
Bilirubin, Direct: 0.1 mg/dL (ref 0.0–0.3)
Total Bilirubin: 0.5 mg/dL (ref 0.2–1.2)
Total Protein: 7.3 g/dL (ref 6.0–8.3)

## 2024-07-21 ENCOUNTER — Telehealth: Payer: Self-pay | Admitting: Internal Medicine

## 2024-07-21 ENCOUNTER — Ambulatory Visit: Payer: Self-pay | Admitting: Internal Medicine

## 2024-07-21 NOTE — Telephone Encounter (Signed)
 Please cancel her lab appt on 07/25/24. She had labs drawn when she was here for appt yesterday. Thanks.

## 2024-07-24 ENCOUNTER — Encounter: Payer: Self-pay | Admitting: Internal Medicine

## 2024-07-24 NOTE — Assessment & Plan Note (Signed)
 The 10-year ASCVD risk score (Arnett DK, et al., 2019) is: 3%   Values used to calculate the score:     Age: 63 years     Clincally relevant sex: Female     Is Non-Hispanic African American: No     Diabetic: No     Tobacco smoker: No     Systolic Blood Pressure: 100 mmHg     Is BP treated: No     HDL Cholesterol: 72.4 mg/dL     Total Cholesterol: 240 mg/dL  Discussed calcium score - screening CAD. Continue diet and exercise. Tolerating crestor. Follow lipid panel. Check liver panel today.

## 2024-07-24 NOTE — Assessment & Plan Note (Signed)
Overall appears to be handling things relatively well.  Follow.   

## 2024-07-25 ENCOUNTER — Other Ambulatory Visit

## 2024-09-18 ENCOUNTER — Other Ambulatory Visit: Payer: Self-pay | Admitting: Internal Medicine

## 2024-10-17 ENCOUNTER — Other Ambulatory Visit

## 2024-10-19 ENCOUNTER — Ambulatory Visit: Admitting: Internal Medicine

## 2024-11-08 ENCOUNTER — Other Ambulatory Visit

## 2024-11-10 ENCOUNTER — Ambulatory Visit: Admitting: Internal Medicine
